# Patient Record
Sex: Female | Born: 1975 | Race: White | Hispanic: No | Marital: Married | Smoking: Never smoker
Health system: Southern US, Community
[De-identification: ages and names within clinical notes are randomized; demographics above are authoritative.]

## PROBLEM LIST (undated history)

## (undated) DIAGNOSIS — K219 Gastro-esophageal reflux disease without esophagitis: Secondary | ICD-10-CM

## (undated) HISTORY — DX: Gastro-esophageal reflux disease without esophagitis: K21.9

## (undated) HISTORY — PX: LASIK: SHX215

## (undated) HISTORY — PX: BREAST SURGERY: SHX581

---

## 1999-03-18 ENCOUNTER — Other Ambulatory Visit: Admission: RE | Admit: 1999-03-18 | Discharge: 1999-03-18 | Payer: Self-pay | Admitting: Family Medicine

## 1999-05-12 ENCOUNTER — Encounter: Payer: Self-pay | Admitting: Family Medicine

## 1999-05-12 ENCOUNTER — Ambulatory Visit (HOSPITAL_COMMUNITY): Admission: RE | Admit: 1999-05-12 | Discharge: 1999-05-12 | Payer: Self-pay | Admitting: Family Medicine

## 1999-07-07 ENCOUNTER — Ambulatory Visit (HOSPITAL_COMMUNITY): Admission: RE | Admit: 1999-07-07 | Discharge: 1999-07-07 | Payer: Self-pay | Admitting: Family Medicine

## 1999-07-07 ENCOUNTER — Encounter: Payer: Self-pay | Admitting: Family Medicine

## 1999-09-06 ENCOUNTER — Inpatient Hospital Stay (HOSPITAL_COMMUNITY): Admission: AD | Admit: 1999-09-06 | Discharge: 1999-09-06 | Payer: Self-pay | Admitting: Family Medicine

## 1999-09-18 ENCOUNTER — Inpatient Hospital Stay (HOSPITAL_COMMUNITY): Admission: AD | Admit: 1999-09-18 | Discharge: 1999-09-19 | Payer: Self-pay | Admitting: Family Medicine

## 2001-10-03 ENCOUNTER — Other Ambulatory Visit: Admission: RE | Admit: 2001-10-03 | Discharge: 2001-10-03 | Payer: Self-pay | Admitting: Family Medicine

## 2002-08-31 ENCOUNTER — Ambulatory Visit (HOSPITAL_COMMUNITY): Admission: RE | Admit: 2002-08-31 | Discharge: 2002-08-31 | Payer: Self-pay | Admitting: Family Medicine

## 2002-08-31 ENCOUNTER — Encounter: Payer: Self-pay | Admitting: Family Medicine

## 2002-09-12 ENCOUNTER — Ambulatory Visit (HOSPITAL_COMMUNITY): Admission: RE | Admit: 2002-09-12 | Discharge: 2002-09-12 | Payer: Self-pay | Admitting: General Surgery

## 2014-02-25 ENCOUNTER — Encounter (INDEPENDENT_AMBULATORY_CARE_PROVIDER_SITE_OTHER): Payer: Self-pay

## 2014-02-25 ENCOUNTER — Encounter: Payer: Self-pay | Admitting: General Practice

## 2014-02-25 ENCOUNTER — Ambulatory Visit (INDEPENDENT_AMBULATORY_CARE_PROVIDER_SITE_OTHER): Payer: BC Managed Care – PPO | Admitting: General Practice

## 2014-02-25 VITALS — BP 118/76 | HR 95 | Temp 98.7°F | Wt 247.8 lb

## 2014-02-25 DIAGNOSIS — J029 Acute pharyngitis, unspecified: Secondary | ICD-10-CM

## 2014-02-25 DIAGNOSIS — J0100 Acute maxillary sinusitis, unspecified: Secondary | ICD-10-CM

## 2014-02-25 LAB — POCT RAPID STREP A (OFFICE): Rapid Strep A Screen: NEGATIVE

## 2014-02-25 MED ORDER — AMOXICILLIN-POT CLAVULANATE 875-125 MG PO TABS: 1.0000 | ORAL_TABLET | Freq: Two times a day (BID) | ORAL | Status: DC

## 2014-02-25 NOTE — Progress Notes (Signed)
   Subjective:    Patient ID: Sharon Foster, female    DOB: 1976/01/26, 38 y.o.   MRN: [id number]  Sinusitis This is a new problem. Episode onset: onset 2 days ago. The problem has been gradually worsening since onset. The maximum temperature recorded prior to her arrival was 100 - 100.9 F. Her pain is at a severity of 3/10. Associated symptoms include ear pain, sinus pressure and a sore throat. Pertinent negatives include no chills, congestion, coughing, headaches, neck pain or shortness of breath. Past treatments include nothing.  Otalgia  There is pain in the left ear. This is a new problem. Episode onset: onset 2 days ago. The problem occurs every few minutes. The problem has been gradually worsening. The pain is at a severity of 3/10. Associated symptoms include a sore throat. Pertinent negatives include no abdominal pain, coughing, drainage, ear discharge, headaches, neck pain, rash or vomiting. She has tried nothing for the symptoms. There is no history of a chronic ear infection or hearing loss.      Review of Systems  Constitutional: Negative for fever and chills.  HENT: Positive for ear pain, sinus pressure and sore throat. Negative for congestion and ear discharge.   Respiratory: Negative for cough, chest tightness and shortness of breath.   Cardiovascular: Negative for chest pain and palpitations.  Gastrointestinal: Negative for vomiting and abdominal pain.  Musculoskeletal: Negative for neck pain.  Skin: Negative for rash.  Neurological: Negative for dizziness, weakness and headaches.       Objective:   Physical Exam  Constitutional: She is oriented to person, place, and time. She appears well-developed and well-nourished.  HENT:  Head: Atraumatic.  Right Ear: External ear normal.  Left Ear: External ear normal.  Nose: Left sinus exhibits maxillary sinus tenderness.  Eyes: EOM are normal. Pupils are equal, round, and reactive to light.  Neck: Normal range of motion.  No thyromegaly present.  Cardiovascular: Normal rate, regular rhythm and normal heart sounds.   Pulmonary/Chest: Effort normal and breath sounds normal. No respiratory distress. She exhibits no tenderness.  Lymphadenopathy:    She has no cervical adenopathy.  Neurological: She is alert and oriented to person, place, and time.  Skin: Skin is warm and dry.  Psychiatric: She has a normal mood and affect.   Results for orders placed in visit on 02/25/14  POCT RAPID STREP A (OFFICE)      Result Value Ref Range   Rapid Strep A Screen Negative  Negative        Assessment & Plan:  1. Sore throat - POCT rapid strep A  2. Acute maxillary sinusitis, recurrence not specified - amoxicillin-clavulanate (AUGMENTIN) 875-125 MG per tablet; Take 1 tablet by mouth 2 (two) times daily.  Dispense: 14 tablet; Refill: 0 -discussed and provided educational material on sinusitis -complete full course of antibiotic  -follow up if symptoms worsen or unresolved -Patient verbalized understanding Coralie KeensMae E. Jamariah Tony, FNP-C

## 2014-02-25 NOTE — Patient Instructions (Signed)
Sinusitis Sinusitis is redness, soreness, and swelling (inflammation) of the paranasal sinuses. Paranasal sinuses are air pockets within the bones of your face (beneath the eyes, the middle of the forehead, or above the eyes). In healthy paranasal sinuses, mucus is able to drain out, and air is able to circulate through them by way of your nose. However, when your paranasal sinuses are inflamed, mucus and air can become trapped. This can allow bacteria and other germs to grow and cause infection. Sinusitis can develop quickly and last only a short time (acute) or continue over a long period (chronic). Sinusitis that lasts for more than 12 weeks is considered chronic.  CAUSES  Causes of sinusitis include:  Allergies.  Structural abnormalities, such as displacement of the cartilage that separates your nostrils (deviated septum), which can decrease the air flow through your nose and sinuses and affect sinus drainage.  Functional abnormalities, such as when the small hairs (cilia) that line your sinuses and help remove mucus do not work properly or are not present. SYMPTOMS  Symptoms of acute and chronic sinusitis are the same. The primary symptoms are pain and pressure around the affected sinuses. Other symptoms include:  Upper toothache.  Earache.  Headache.  Bad breath.  Decreased sense of smell and taste.  A cough, which worsens when you are lying flat.  Fatigue.  Fever.  Thick drainage from your nose, which often is green and may contain pus (purulent).  Swelling and warmth over the affected sinuses. DIAGNOSIS  Your caregiver will perform a physical exam. During the exam, your caregiver may:  Look in your nose for signs of abnormal growths in your nostrils (nasal polyps).  Tap over the affected sinus to check for signs of infection.  View the inside of your sinuses (endoscopy) with a special imaging device with a light attached (endoscope), which is inserted into your  sinuses. If your caregiver suspects that you have chronic sinusitis, one or more of the following tests may be recommended:  Allergy tests.  Nasal culture--A sample of mucus is taken from your nose and sent to a lab and screened for bacteria.  Nasal cytology--A sample of mucus is taken from your nose and examined by your caregiver to determine if your sinusitis is related to an allergy. TREATMENT  Most cases of acute sinusitis are related to a viral infection and will resolve on their own within 10 days. Sometimes medicines are prescribed to help relieve symptoms (pain medicine, decongestants, nasal steroid sprays, or saline sprays).  However, for sinusitis related to a bacterial infection, your caregiver will prescribe antibiotic medicines. These are medicines that will help kill the bacteria causing the infection.  Rarely, sinusitis is caused by a fungal infection. In theses cases, your caregiver will prescribe antifungal medicine. For some cases of chronic sinusitis, surgery is needed. Generally, these are cases in which sinusitis recurs more than 3 times per year, despite other treatments. HOME CARE INSTRUCTIONS   Drink plenty of water. Water helps thin the mucus so your sinuses can drain more easily.  Use a humidifier.  Inhale steam 3 to 4 times a day (for example, sit in the bathroom with the shower running).  Apply a warm, moist washcloth to your face 3 to 4 times a day, or as directed by your caregiver.  Use saline nasal sprays to help moisten and clean your sinuses.  Take over-the-counter or prescription medicines for pain, discomfort, or fever only as directed by your caregiver. SEEK IMMEDIATE MEDICAL CARE IF:    You have increasing pain or severe headaches.  You have nausea, vomiting, or drowsiness.  You have swelling around your face.  You have vision problems.  You have a stiff neck.  You have difficulty breathing. MAKE SURE YOU:   Understand these  instructions.  Will watch your condition.  Will get help right away if you are not doing well or get worse. Document Released: 08/09/2005 Document Revised: 11/01/2011 Document Reviewed: 08/24/2011 Exit[employer] Patient Information 2015 [employer]. This information is not intended to replace advice given to you by your health care provider. Make sure you discuss any questions you have with your health care provider.  

## 2014-05-03 ENCOUNTER — Ambulatory Visit (INDEPENDENT_AMBULATORY_CARE_PROVIDER_SITE_OTHER): Payer: BC Managed Care – PPO | Admitting: Nurse Practitioner

## 2014-05-03 ENCOUNTER — Encounter: Payer: Self-pay | Admitting: Nurse Practitioner

## 2014-05-03 VITALS — BP 104/65 | HR 67 | Temp 97.6°F | Ht 65.0 in | Wt 242.6 lb

## 2014-05-03 DIAGNOSIS — J069 Acute upper respiratory infection, unspecified: Secondary | ICD-10-CM

## 2014-05-03 DIAGNOSIS — J029 Acute pharyngitis, unspecified: Secondary | ICD-10-CM

## 2014-05-03 LAB — POCT RAPID STREP A (OFFICE): Rapid Strep A Screen: NEGATIVE

## 2014-05-03 MED ORDER — AMOXICILLIN 875 MG PO TABS: 875.0000 mg | ORAL_TABLET | Freq: Two times a day (BID) | ORAL | Status: DC

## 2014-05-03 NOTE — Progress Notes (Signed)
   Subjective:    Patient ID: Sharon Foster, female    DOB: Feb 24, 1976, 38 y.o.   MRN: [id number]  HPI Patient in today with c/o sore throat and headache- started over a week ago- Has swollen glands in neck.    Review of Systems  Constitutional: Positive for appetite change. Negative for fever and chills.  HENT: Positive for congestion, sinus pressure, sore throat, trouble swallowing and voice change.   Respiratory: Negative for cough.   Cardiovascular: Negative.   Gastrointestinal: Negative.   Musculoskeletal: Negative.   Neurological: Negative.   Psychiatric/Behavioral: Negative.   All other systems reviewed and are negative.      Objective:   Physical Exam  Constitutional: She is oriented to person, place, and time. She appears well-developed and well-nourished. She appears distressed.  HENT:  Right Ear: Hearing, tympanic membrane, external ear and ear canal normal.  Left Ear: Hearing, tympanic membrane, external ear and ear canal normal.  Nose: Mucosal edema and rhinorrhea present. Right sinus exhibits no maxillary sinus tenderness and no frontal sinus tenderness. Left sinus exhibits no maxillary sinus tenderness and no frontal sinus tenderness.  Mouth/Throat: Uvula is midline. Posterior oropharyngeal erythema (mild) present.  Eyes: Pupils are equal, round, and reactive to light.  Neck: Normal range of motion.  Cardiovascular: Normal rate, regular rhythm and normal heart sounds.   Pulmonary/Chest: Effort normal and breath sounds normal.  Lymphadenopathy:    She has no cervical adenopathy.  Neurological: She is alert and oriented to person, place, and time.  Skin: Skin is warm and dry.  Psychiatric: She has a normal mood and affect. Her behavior is normal. Judgment and thought content normal.   BP 104/65  Pulse 67  Temp(Src) 97.6 F (36.4 C) (Oral)  Ht  (1.651 m)  Wt 242 lb 9.6 oz (110.043 kg)  BMI 40.37 kg/m2          Assessment & Plan:   1. Sore  throat   2. Upper respiratory infection with cough and congestion    Meds ordered this encounter  Medications  . amoxicillin (AMOXIL) 875 MG tablet    Sig: Take 1 tablet (875 mg total) by mouth 2 (two) times daily.    Dispense:  20 tablet    Refill:  0    Order Specific Question:  Supervising Provider    Answer:  Ernestina Penna [[id number]]   1. Take meds as prescribed 2. Use a cool mist humidifier especially during the winter months and when heat has been humid. 3. Use saline nose sprays frequently 4. Saline irrigations of the nose can be very helpful if done frequently.  * 4X daily for 1 week*  * Use of a nettie pot can be helpful with this. Follow directions with this* 5. Drink plenty of fluids 6. Keep thermostat turn down low 7.For any cough or congestion  Use plain Mucinex- regular strength or max strength is fine   * Children- consult with Pharmacist for dosing 8. For fever or aces or pains- take tylenol or ibuprofen appropriate for age and weight.  * for fevers greater than 101 orally you may alternate ibuprofen and tylenol every  3 hours.   Mary-Margaret Daphine Deutscher, FNP

## 2014-05-03 NOTE — Patient Instructions (Signed)

## 2014-05-09 ENCOUNTER — Telehealth: Payer: Self-pay | Admitting: Nurse Practitioner

## 2014-05-09 NOTE — Telephone Encounter (Signed)
Was given amoxicillin shouldn't need another antibiotic

## 2014-05-10 NOTE — Telephone Encounter (Signed)
Patient agreed to plan.

## 2014-05-10 NOTE — Telephone Encounter (Signed)
Nasal congestion with clear nasal discharge and ear pressure. Suggested she continue allergy medication, increase fluid intake, and add pseudoephedrine  1-2 tabs every 4-6 hrs PRN. Follow up if symptoms persist or worsen.

## 2014-07-01 ENCOUNTER — Encounter: Payer: Self-pay | Admitting: Nurse Practitioner

## 2014-07-01 ENCOUNTER — Encounter (INDEPENDENT_AMBULATORY_CARE_PROVIDER_SITE_OTHER): Payer: Self-pay

## 2014-07-01 ENCOUNTER — Ambulatory Visit (INDEPENDENT_AMBULATORY_CARE_PROVIDER_SITE_OTHER): Payer: BC Managed Care – PPO | Admitting: Nurse Practitioner

## 2014-07-01 VITALS — BP 132/88 | HR 87 | Temp 97.8°F | Ht 65.0 in | Wt 244.0 lb

## 2014-07-01 DIAGNOSIS — M25511 Pain in right shoulder: Secondary | ICD-10-CM

## 2014-07-01 DIAGNOSIS — R2231 Localized swelling, mass and lump, right upper limb: Secondary | ICD-10-CM

## 2014-07-01 DIAGNOSIS — M545 Low back pain, unspecified: Secondary | ICD-10-CM

## 2014-07-01 LAB — POCT UA - MICROSCOPIC ONLY
Bacteria, U Microscopic: NEGATIVE
Casts, Ur, LPF, POC: NEGATIVE
Crystals, Ur, HPF, POC: NEGATIVE
Mucus, UA: NEGATIVE
RBC, urine, microscopic: NEGATIVE
Yeast, UA: NEGATIVE

## 2014-07-01 LAB — POCT URINALYSIS DIPSTICK
Blood, UA: NEGATIVE
Leukocytes, UA: NEGATIVE
Nitrite, UA: NEGATIVE
Protein, UA: NEGATIVE
Spec Grav, UA: 1.01
Urobilinogen, UA: NEGATIVE
pH, UA: 6.5

## 2014-07-01 MED ORDER — CYCLOBENZAPRINE HCL 5 MG PO TABS: 5.0000 mg | ORAL_TABLET | Freq: Three times a day (TID) | ORAL | Status: DC | PRN

## 2014-07-01 MED ORDER — PREDNISONE (PAK) 10 MG PO TABS: ORAL_TABLET | Freq: Every day | ORAL | Status: DC

## 2014-07-01 NOTE — Progress Notes (Signed)
   Subjective:    Patient ID: Trula Sladearlene S Sarracino, female    DOB: Oct 04, 1975, 38 y.o.   MRN: [id number]  HPI Patient in today c/o : - cyst in left [place]- showed provider at health department but they were not concerned. - right shoulder pain- can raise arm up but when gets to a certain place it hurts -Has pulling sensation in mod lower back and her urine smells  Strong.    Review of Systems  Constitutional: Negative.   HENT: Negative.   Respiratory: Negative.   Genitourinary: Negative.   Neurological: Negative.   Psychiatric/Behavioral: Negative.   All other systems reviewed and are negative.      Objective:   Physical Exam  Constitutional: She is oriented to person, place, and time. She appears well-developed and well-nourished.  Cardiovascular: Normal rate, regular rhythm and normal heart sounds.   Pulmonary/Chest: Effort normal and breath sounds normal.  Abdominal: Soft. Bowel sounds are normal.  Musculoskeletal:  Decreased ROM of right arm due to shoulder pain with external rotation and abduction. Grips equal bil Low back pain on palpation- FROM of lumbar sine without pain- (-) SLR bil  Neurological: She is alert and oriented to person, place, and time. She has normal reflexes.  Skin: Skin is warm.  2cm non mobile superficial nodule right axillia- no drainage - no erythema  Psychiatric: She has a normal mood and affect. Her behavior is normal. Judgment and thought content normal.   BP 132/88 mmHg  Pulse 87  Temp(Src) 97.8 F (36.6 C) (Oral)  Ht 5\' 5"  (1.651 m)  Wt 244 lb (110.678 kg)  BMI 40.60 kg/m2          Assessment & Plan:  1. Bilateral low back pain without sciatica Moist heat  Rest Back strengthening exercises - POCT urinalysis dipstick - POCT UA - Microscopic Only  2. Pain in joint, shoulder region, right Rest shoulder - cyclobenzaprine (FLEXERIL) 5 MG tablet; Take 1 tablet (5 mg total) by mouth 3 (three) times daily as needed for muscle spasms.   Dispense: 30 tablet; Refill: 1 - predniSONE (STERAPRED UNI-PAK) 10 MG tablet; Take by mouth daily. As directed X 6 days  Dispense: 21 tablet; Refill: 0  3. Axillary mass, right Patient wants to watch- when insurance changes will schedule U/S   Mary-Margaret Daphine DeutscherMartin, FNP

## 2014-07-01 NOTE — Patient Instructions (Signed)

## 2015-01-06 ENCOUNTER — Encounter: Payer: Self-pay | Admitting: Family Medicine

## 2015-01-06 ENCOUNTER — Ambulatory Visit (INDEPENDENT_AMBULATORY_CARE_PROVIDER_SITE_OTHER): Payer: PRIVATE HEALTH INSURANCE | Admitting: Family Medicine

## 2015-01-06 VITALS — BP 100/57 | HR 89 | Temp 99.5°F | Ht 65.0 in | Wt 239.0 lb

## 2015-01-06 DIAGNOSIS — J019 Acute sinusitis, unspecified: Secondary | ICD-10-CM | POA: Diagnosis not present

## 2015-01-06 DIAGNOSIS — J209 Acute bronchitis, unspecified: Secondary | ICD-10-CM | POA: Diagnosis not present

## 2015-01-06 MED ORDER — AMOXICILLIN-POT CLAVULANATE 875-125 MG PO TABS: 1.0000 | ORAL_TABLET | Freq: Two times a day (BID) | ORAL | Status: DC

## 2015-01-06 NOTE — Progress Notes (Signed)
   Subjective:    Patient ID: Sharon Foster, female    DOB: Mar 22, 1976, 39 y.o.   MRN: [id number]  HPI   39 year old female comes in today with complaints of nasal congestion, rhinorrhea, post nasal drainage, low grade fever, and cough. She has taken Advil 600mg . this is been going on for about 3 days. The patient works in a nursing home. She has had a fever over the past 3 days at 201.   Review of Systems  Constitutional: Positive for fever, chills and fatigue.  HENT: Positive for congestion, ear pain, postnasal drip, rhinorrhea and sinus pressure.   Eyes: Negative.   Respiratory: Positive for cough.   Cardiovascular: Negative.   Gastrointestinal: Negative.   Endocrine: Negative.   Genitourinary: Negative.   Musculoskeletal: Negative.   Skin: Negative.   Allergic/Immunologic: Negative.   Neurological: Negative.   Hematological: Negative.   Psychiatric/Behavioral: Negative.        Objective:   Physical Exam  Constitutional: She is oriented to person, place, and time. She appears well-developed and well-nourished. No distress.  HENT:  Head: Normocephalic and atraumatic.  Right Ear: External ear normal.  Left Ear: External ear normal.  Mouth/Throat: Oropharynx is clear and moist. No oropharyngeal exudate.  Ethmoid and frontal sinus tenderness Nasal turbinate congestion and swelling bilaterally  Eyes: Conjunctivae and EOM are normal. Pupils are equal, round, and reactive to light. Right eye exhibits no discharge. Left eye exhibits no discharge. No scleral icterus.  Neck: Normal range of motion. Neck supple. No thyromegaly present.  Cardiovascular: Normal rate and regular rhythm.  Exam reveals no friction rub.   No murmur heard. Pulmonary/Chest: Effort normal. No respiratory distress. She has no wheezes. She has no rales. She exhibits no tenderness.  Rhonchi with coughing  Abdominal: She exhibits no mass.  Musculoskeletal: Normal range of motion.  Lymphadenopathy:    She  has no cervical adenopathy.  Neurological: She is alert and oriented to person, place, and time.  Skin: Skin is warm and dry. No rash noted.  Psychiatric: She has a normal mood and affect. Her behavior is normal. Judgment and thought content normal.  Nursing note and vitals reviewed.          Assessment & Plan:  1. Acute rhinosinusitis -Use nasal saline during the day, use Flonase at nighttime, and take an antihistamine peel as directed one daily -Drink plenty of fluids and take Tylenol or ibuprofen as needed for aches pains and fever - amoxicillin-clavulanate (AUGMENTIN) 875-125 MG per tablet; Take 1 tablet by mouth 2 (two) times daily.  Dispense: 20 tablet; Refill: 0  2. Acute bronchitis, unspecified organism -Take Mucinex as directed twice daily with a large glass of water  Patient Instructions  Drink plenty of fluids Take ibuprofen alternating with Tylenol as needed for aches pains and fever Take Mucinex maximum strength, blue and white in color, plain, 1 twice daily with a large glass of water for cough and congestion Use nasal saline frequently through the day Use the Flonase 1-2 sprays each nostril at bedtime--- stay on this regularly for several weeks Take antibiotic as directed Use one of the following 3---- Allegra, Zyrtec, or Claritin. Zyrtec is more likely to make you drowsy.   Nyra Capeson W. Mollye Guinta MD

## 2015-01-06 NOTE — Patient Instructions (Signed)
Drink plenty of fluids Take ibuprofen alternating with Tylenol as needed for aches pains and fever Take Mucinex maximum strength, blue and white in color, plain, 1 twice daily with a large glass of water for cough and congestion Use nasal saline frequently through the day Use the Flonase 1-2 sprays each nostril at bedtime--- stay on this regularly for several weeks Take antibiotic as directed Use one of the following 3---- Allegra, Zyrtec, or Claritin. Zyrtec is more likely to make you drowsy.

## 2015-01-13 ENCOUNTER — Telehealth: Payer: Self-pay | Admitting: Family Medicine

## 2015-01-13 NOTE — Telephone Encounter (Signed)
Lm - needs to know more about the mess - more about the coughing up blood

## 2015-01-14 NOTE — Telephone Encounter (Signed)
Pt states that she is still having cough, A LOT of congestion ( some blood streaked) (no blood today) Some light headedness, and NO fever  She feels that she needs a different antibiotic. She does not have the money to come in for another office visit. Seen 01/06/15- [facility]  [facility].  Please address

## 2015-01-14 NOTE — Telephone Encounter (Signed)
Continue current treatment and give antibiotics more time to work

## 2015-01-14 NOTE — Telephone Encounter (Signed)
Patient aware and will call us if she does not feel better.

## 2015-01-17 ENCOUNTER — Telehealth: Payer: Self-pay | Admitting: Family Medicine

## 2015-01-17 MED ORDER — SULFAMETHOXAZOLE-TRIMETHOPRIM 800-160 MG PO TABS: 1.0000 | ORAL_TABLET | Freq: Two times a day (BID) | ORAL | Status: DC

## 2015-01-17 NOTE — Telephone Encounter (Signed)
Continues to have productive cough, head congestion, and dizziness. Denies fever.  She completed Augmentin yesterday morning.  She is taking Mucinex OTC.

## 2015-01-17 NOTE — Telephone Encounter (Signed)
Septra DS sent in to pharmacy. Patient notified of suggestions.  She will call back if symptoms worsen or fail to improve.

## 2015-01-17 NOTE — Telephone Encounter (Signed)
Discontinue Augmentin and change to Septra DS 1 twice daily for 10 days with food for infection until completed Continue nasal saline and an antihistamine like Claritin, or Allegra, or Zyrtec. Also continue with Mucinex for cough and congestion--- the plain blue and white woman and take twice daily with a large glass of water

## 2015-05-13 ENCOUNTER — Telehealth: Payer: Self-pay | Admitting: Nurse Practitioner

## 2015-05-21 NOTE — Telephone Encounter (Signed)
Several attempts have been made to contact patient this encounter will be closed.  

## 2015-09-12 ENCOUNTER — Ambulatory Visit (INDEPENDENT_AMBULATORY_CARE_PROVIDER_SITE_OTHER): Payer: BC Managed Care – PPO | Admitting: Family Medicine

## 2015-09-12 ENCOUNTER — Encounter: Payer: Self-pay | Admitting: Family Medicine

## 2015-09-12 VITALS — BP 104/65 | HR 74 | Temp 98.3°F | Ht 65.0 in | Wt 245.0 lb

## 2015-09-12 DIAGNOSIS — E669 Obesity, unspecified: Secondary | ICD-10-CM | POA: Diagnosis not present

## 2015-09-12 DIAGNOSIS — R202 Paresthesia of skin: Secondary | ICD-10-CM | POA: Diagnosis not present

## 2015-09-12 DIAGNOSIS — L723 Sebaceous cyst: Secondary | ICD-10-CM

## 2015-09-12 DIAGNOSIS — I868 Varicose veins of other specified sites: Secondary | ICD-10-CM | POA: Diagnosis not present

## 2015-09-12 DIAGNOSIS — I8390 Asymptomatic varicose veins of unspecified lower extremity: Secondary | ICD-10-CM

## 2015-09-12 DIAGNOSIS — B372 Candidiasis of skin and nail: Secondary | ICD-10-CM

## 2015-09-12 MED ORDER — FLUCONAZOLE 100 MG PO TABS: 100.0000 mg | ORAL_TABLET | Freq: Every day | ORAL | Status: DC

## 2015-09-12 NOTE — Progress Notes (Signed)
Subjective:  Patient ID: Sharon Foster, female    DOB: [birth year]  Age: 40 y.o. MRN: [id number]  CC: numbness and tingling in hands and Cold Feet   HPI Sharon Foster presents for feet have a smell to them for two weeks. Symptom is mild to moderate. Turn blue in the bathtub. Onset several months ago. Seems to be increasing slightly. Numbness in both index fingers.  History Sharon Foster has a past medical history of GERD (gastroesophageal reflux disease).   She has past surgical history that includes Breast surgery and LASIK.   Her family history includes Cancer in her father; Hypertension in her father and mother.She reports that she has never smoked. She has never used smokeless tobacco. She reports that she does not drink alcohol or use illicit drugs.  Outpatient Prescriptions Prior to Visit  Medication Sig Dispense Refill  . esomeprazole (NEXIUM) 40 MG capsule Take 40 mg by mouth daily at 12 noon. Reported on 09/12/2015    . amoxicillin-clavulanate (AUGMENTIN) 875-125 MG per tablet Take 1 tablet by mouth 2 (two) times daily. 20 tablet 0  . sulfamethoxazole-trimethoprim (BACTRIM DS,SEPTRA DS) 800-160 MG per tablet Take 1 tablet by mouth 2 (two) times daily with a meal. 20 tablet 0   No facility-administered medications prior to visit.    ROS Review of Systems  Constitutional: Negative for fever, activity change and appetite change.  HENT: Negative for congestion, rhinorrhea and sore throat.   Eyes: Negative for visual disturbance.  Respiratory: Negative for cough and shortness of breath.   Cardiovascular: Negative for chest pain and palpitations.  Gastrointestinal: Negative for nausea, abdominal pain and diarrhea.  Genitourinary: Negative for dysuria.  Musculoskeletal: Negative for myalgias and arthralgias.    Objective:  BP 104/65 mmHg  Pulse 74  Temp(Src) 98.3 F (36.8 C) (Oral)  Ht _0  (1.651 m)  Wt 245 lb (111.131 kg)  BMI 40.77 kg/m2  SpO2 99%  BP Readings  from Last 3 Encounters:  09/12/15 104/65  01/06/15 100/57  07/01/14 132/88    Wt Readings from Last 3 Encounters:  09/12/15 245 lb (111.131 kg)  01/06/15 239 lb (108.41 kg)  07/01/14 244 lb (110.678 kg)     Physical Exam  Constitutional: She is oriented to person, place, and time. She appears well-developed and well-nourished. No distress.  HENT:  Head: Normocephalic and atraumatic.  Right Ear: External ear normal.  Left Ear: External ear normal.  Nose: Nose normal.  Mouth/Throat: Oropharynx is clear and moist.  Eyes: Conjunctivae and EOM are normal. Pupils are equal, round, and reactive to light.  Neck: Normal range of motion. Neck supple. No thyromegaly present.  Cardiovascular: Normal rate, regular rhythm and normal heart sounds.   No murmur heard. Multiple varicosities covering much of the dorsal forefoot bilaterally. Ropey varicosities in both calves  Pulmonary/Chest: Effort normal and breath sounds normal. No respiratory distress. She has no wheezes. She has no rales.  Abdominal: Soft. Bowel sounds are normal. She exhibits no distension. There is no tenderness.  Lymphadenopathy:    She has no cervical adenopathy.  Neurological: She is alert and oriented to person, place, and time. She has normal reflexes.  Skin: Skin is warm and dry. Lesion (1 cm freely mobile cyst without signs of infection at the right axilla.) noted.  Slight scale between toes mild scaling and erythema on the dorsum of the feet  Psychiatric: She has a normal mood and affect. Her behavior is normal. Judgment and thought content normal.  Lab Results  Component Value Date   WBC 5.0 09/12/2015   HCT 41.5 09/12/2015   PLT 198 09/12/2015   GLUCOSE 104* 09/12/2015   CHOL 102 09/12/2015   TRIG 52 09/12/2015   HDL 41 09/12/2015   LDLCALC 51 09/12/2015   ALT 14 09/12/2015   AST 21 09/12/2015   NA 141 09/12/2015   K 4.2 09/12/2015   CL 103 09/12/2015   CREATININE 0.93 09/12/2015   BUN 18  09/12/2015   CO2 26 09/12/2015    No results found.  Assessment & Plan:   Sharon Foster was seen today for numbness and tingling in hands and cold feet.  Diagnoses and all orders for this visit:  Varicose veins -     Ambulatory referral to Vascular Surgery -     CBC with Differential/Platelet -     CMP14+EGFR -     Lipid panel  Yeast dermatitis -     Ambulatory referral to Dermatology -     CBC with Differential/Platelet -     CMP14+EGFR -     Lipid panel  Paresthesia of both hands -     NCV with EMG(electromyography); Future -     CBC with Differential/Platelet -     CMP14+EGFR -     Lipid panel  Sebaceous cyst of right axilla -     Ambulatory referral to Dermatology -     CBC with Differential/Platelet -     CMP14+EGFR -     Lipid panel  Obesity  Other orders -     fluconazole (DIFLUCAN) 100 MG tablet; Take 1 tablet (100 mg total) by mouth daily.   I have discontinued Sharon Foster's amoxicillin-clavulanate and sulfamethoxazole-trimethoprim. I am also having her start on fluconazole. Additionally, I am having her maintain her esomeprazole.  Meds ordered this encounter  Medications  . fluconazole (DIFLUCAN) 100 MG tablet    Sig: Take 1 tablet (100 mg total) by mouth daily.    Dispense:  15 tablet    Refill:  0     Follow-up: Return in about 1 month (around 10/13/2015).  Sharon Foster, M.D.

## 2015-09-12 NOTE — Patient Instructions (Signed)

## 2015-09-13 LAB — CMP14+EGFR
A/G RATIO: 1.6 (ref 1.1–2.5)
ALK PHOS: 75 IU/L (ref 39–117)
ALT: 14 IU/L (ref 0–32)
AST: 21 IU/L (ref 0–40)
Albumin: 4.2 g/dL (ref 3.5–5.5)
BUN/Creatinine Ratio: 19 (ref 8–20)
BUN: 18 mg/dL (ref 6–20)
Bilirubin Total: 0.8 mg/dL (ref 0.0–1.2)
CHLORIDE: 103 mmol/L (ref 96–106)
CO2: 26 mmol/L (ref 18–29)
Calcium: 9.2 mg/dL (ref 8.7–10.2)
Creatinine, Ser: 0.93 mg/dL (ref 0.57–1.00)
GFR calc Af Amer: 90 mL/min/{1.73_m2} (ref >59)
GFR calc non Af Amer: 78 mL/min/{1.73_m2} (ref >59)
Globulin, Total: 2.7 g/dL (ref 1.5–4.5)
Glucose: 104 mg/dL — ABNORMAL HIGH (ref 65–99)
Potassium: 4.2 mmol/L (ref 3.5–5.2)
SODIUM: 141 mmol/L (ref 134–144)
Total Protein: 6.9 g/dL (ref 6.0–8.5)

## 2015-09-13 LAB — CBC WITH DIFFERENTIAL/PLATELET
BASOS ABS: 0 10*3/uL (ref 0.0–0.2)
Basos: 1 %
EOS (ABSOLUTE): 0.1 10*3/uL (ref 0.0–0.4)
EOS: 1 %
HEMATOCRIT: 41.5 % (ref 34.0–46.6)
Hemoglobin: 14.4 g/dL (ref 11.1–15.9)
Immature Grans (Abs): 0 10*3/uL (ref 0.0–0.1)
Immature Granulocytes: 0 %
LYMPHS ABS: 1.9 10*3/uL (ref 0.7–3.1)
Lymphs: 38 %
MCH: 32 pg (ref 26.6–33.0)
MCHC: 34.7 g/dL (ref 31.5–35.7)
MCV: 92 fL (ref 79–97)
MONOCYTES: 5 %
MONOS ABS: 0.3 10*3/uL (ref 0.1–0.9)
NEUTROS ABS: 2.7 10*3/uL (ref 1.4–7.0)
Neutrophils: 55 %
Platelets: 198 10*3/uL (ref 150–379)
RBC: 4.5 x10E6/uL (ref 3.77–5.28)
RDW: 12 % — AB (ref 12.3–15.4)
WBC: 5 10*3/uL (ref 3.4–10.8)

## 2015-09-13 LAB — LIPID PANEL
CHOLESTEROL TOTAL: 102 mg/dL (ref 100–199)
Chol/HDL Ratio: 2.5 ratio units (ref 0.0–4.4)
HDL: 41 mg/dL (ref >39)
LDL CALC: 51 mg/dL (ref 0–99)
TRIGLYCERIDES: 52 mg/dL (ref 0–149)
VLDL Cholesterol Cal: 10 mg/dL (ref 5–40)

## 2015-10-17 ENCOUNTER — Telehealth: Payer: Self-pay | Admitting: Nurse Practitioner

## 2016-08-27 ENCOUNTER — Ambulatory Visit (INDEPENDENT_AMBULATORY_CARE_PROVIDER_SITE_OTHER): Payer: BC Managed Care – PPO | Admitting: Family Medicine

## 2016-08-27 ENCOUNTER — Encounter: Payer: Self-pay | Admitting: Family Medicine

## 2016-08-27 VITALS — BP 115/75 | Temp 98.5°F | Resp 18 | Ht 65.0 in | Wt 229.8 lb

## 2016-08-27 DIAGNOSIS — J0100 Acute maxillary sinusitis, unspecified: Secondary | ICD-10-CM

## 2016-08-27 MED ORDER — AZITHROMYCIN 250 MG PO TABS: ORAL_TABLET | ORAL | 0 refills | Status: DC

## 2016-08-27 NOTE — Progress Notes (Signed)
BP 115/75 (BP Location: Left Arm, Patient Position: Sitting, Cuff Size: Normal)   Temp 98.5 F (36.9 C) (Oral)   Resp 18   Ht 5\' 5"  (1.651 m)   Wt 229 lb 12.8 oz (104.2 kg)   SpO2 98%   BMI 38.24 kg/m    Subjective:    Patient ID: Sharon Foster, female    DOB: April 21, 1976, 41 y.o.   MRN: [id number]  HPI: Sharon Foster is a 41 y.o. female presenting on 08/27/2016 for URI (prod cough (dk green), congestion, fever started on Tuesday)   HPI Cough and congestion and fever Patient comes in today complaining of cough and congestion and fever this been going on for the past 3 days. She has been having productive green sputum along with her cough and also sinus pressure and postnasal drainage. She says her fever was low-grade and was 99. She said that she also had some chills but no body aches. She denies any sick contacts that she knows of. She has been using some over-the-counter sinus and cold medications without much success.  Relevant past medical, surgical, family and social history reviewed and updated as indicated. Interim medical history since our last visit reviewed. Allergies and medications reviewed and updated.  Review of Systems  Constitutional: Positive for fever. Negative for chills.  HENT: Positive for congestion, postnasal drip, rhinorrhea, sinus pressure and sore throat. Negative for ear discharge, ear pain and sneezing.   Eyes: Negative for pain, redness and visual disturbance.  Respiratory: Positive for cough. Negative for chest tightness and shortness of breath.   Cardiovascular: Negative for chest pain and leg swelling.  Genitourinary: Negative for difficulty urinating and dysuria.  Musculoskeletal: Negative for back pain and gait problem.  Skin: Negative for rash.  Neurological: Negative for light-headedness and headaches.  Psychiatric/Behavioral: Negative for agitation and behavioral problems.  All other systems reviewed and are negative.   Per HPI unless  specifically indicated above     Objective:    BP 115/75 (BP Location: Left Arm, Patient Position: Sitting, Cuff Size: Normal)   Temp 98.5 F (36.9 C) (Oral)   Resp 18   Ht 5\' 5"  (1.651 m)   Wt 229 lb 12.8 oz (104.2 kg)   SpO2 98%   BMI 38.24 kg/m   Wt Readings from Last 3 Encounters:  08/27/16 229 lb 12.8 oz (104.2 kg)  09/12/15 245 lb (111.1 kg)  01/06/15 239 lb (108.4 kg)    Physical Exam  Constitutional: She is oriented to person, place, and time. She appears well-developed and well-nourished. No distress.  HENT:  Right Ear: Tympanic membrane, external ear and ear canal normal.  Left Ear: Tympanic membrane, external ear and ear canal normal.  Nose: Mucosal edema and rhinorrhea present. No epistaxis. Right sinus exhibits maxillary sinus tenderness. Right sinus exhibits no frontal sinus tenderness. Left sinus exhibits maxillary sinus tenderness. Left sinus exhibits no frontal sinus tenderness.  Mouth/Throat: Uvula is midline and mucous membranes are normal. Posterior oropharyngeal edema and posterior oropharyngeal erythema present. No oropharyngeal exudate or tonsillar abscesses.  Eyes: Conjunctivae and EOM are normal.  Cardiovascular: Normal rate, regular rhythm, normal heart sounds and intact distal pulses.   No murmur heard. Pulmonary/Chest: Effort normal and breath sounds normal. No respiratory distress. She has no wheezes.  Musculoskeletal: Normal range of motion. She exhibits no edema or tenderness.  Neurological: She is alert and oriented to person, place, and time. Coordination normal.  Skin: Skin is warm and dry. No rash noted.  She is not diaphoretic.  Psychiatric: She has a normal mood and affect. Her behavior is normal.  Vitals reviewed.     Assessment & Plan:   Problem List Items Addressed This Visit    None    Visit Diagnoses    Acute non-recurrent maxillary sinusitis    -  Primary   Relevant Medications   azithromycin (ZITHROMAX) 250 MG tablet        Follow up plan: Return if symptoms worsen or fail to improve.  Counseling provided for all of the vaccine components No orders of the defined types were placed in this encounter.   Arville Care, MD [facility] 08/27/2016, 6:38 PM

## 2016-08-30 ENCOUNTER — Telehealth: Payer: Self-pay | Admitting: Family Medicine

## 2016-08-30 MED ORDER — PREDNISONE 20 MG PO TABS: ORAL_TABLET | ORAL | 0 refills | Status: DC

## 2016-08-30 NOTE — Telephone Encounter (Signed)
Pt aware.

## 2016-08-31 ENCOUNTER — Ambulatory Visit (INDEPENDENT_AMBULATORY_CARE_PROVIDER_SITE_OTHER): Payer: BC Managed Care – PPO | Admitting: Family Medicine

## 2016-08-31 ENCOUNTER — Encounter: Payer: Self-pay | Admitting: Family Medicine

## 2016-08-31 ENCOUNTER — Ambulatory Visit (INDEPENDENT_AMBULATORY_CARE_PROVIDER_SITE_OTHER): Payer: BC Managed Care – PPO

## 2016-08-31 VITALS — BP 112/67 | HR 83 | Temp 97.9°F | Ht 65.0 in | Wt 230.4 lb

## 2016-08-31 DIAGNOSIS — R05 Cough: Secondary | ICD-10-CM

## 2016-08-31 DIAGNOSIS — J069 Acute upper respiratory infection, unspecified: Secondary | ICD-10-CM

## 2016-08-31 DIAGNOSIS — R059 Cough, unspecified: Secondary | ICD-10-CM

## 2016-08-31 MED ORDER — HYDROCODONE-HOMATROPINE 5-1.5 MG/5ML PO SYRP: 5.0000 mL | ORAL_SOLUTION | Freq: Four times a day (QID) | ORAL | 0 refills | Status: DC | PRN

## 2016-08-31 NOTE — Progress Notes (Signed)
   Subjective:    Patient ID: Sharon Foster, female    DOB: [birth year], 41 y.o.   MRN: [id number]  HPI 41 year old female with multiple symptoms. She was treated about one week ago with a Z-Pak. Since that treatment her fever has gone. Her cough is no longer productive. She still continues to cough. She says she has not slept in 3 days due to the cough. There is fullness in her ears. She was started on prednisone day prior to visit and has not seen improvement. She also complains of some numbness in her right hand. This does not seem to be related to her cough.  There are no active problems to display for this patient.  Outpatient Encounter Prescriptions as of 08/31/2016  Medication Sig  . esomeprazole (NEXIUM) 40 MG capsule Take 40 mg by mouth daily at 12 noon. Reported on 09/12/2015  . predniSONE (DELTASONE) 20 MG tablet 2 po at same time daily for 5 days  . [DISCONTINUED] azithromycin (ZITHROMAX) 250 MG tablet Take 2 the first day and then one each day after.   No facility-administered encounter medications on file as of 08/31/2016.       Review of Systems  Constitutional: Negative.   HENT: Positive for congestion.   Respiratory: Positive for cough.   Musculoskeletal: Positive for back pain.       Objective:   Physical Exam  Constitutional: She is oriented to person, place, and time. She appears well-developed and well-nourished.  She looks well  HENT:  Mouth/Throat: Oropharynx is clear and moist.  Cardiovascular: Normal rate, regular rhythm and normal heart sounds.   Pulmonary/Chest: Effort normal and breath sounds normal.  Musculoskeletal: Normal range of motion.  Neurological: She is alert and oriented to person, place, and time.   BP 112/67   Pulse 83   Temp 97.9 F (36.6 C) (Oral)   Ht 5\' 5"  (1.651 m)   Wt 230 lb 6.4 oz (104.5 kg)   BMI 38.34 kg/m   Chest x-ray is normal      Assessment & Plan:  1. Cough Think the cough is likely related to some residual  bronchial inflammation after bronchitis. Rather than prednisone with it's more systemic side effects have given her a sample of Symbicort to use - DG Chest 2 View; Future  2. Acute upper respiratory infection See above for treatment  Frederica KusterStephen M Miller MD

## 2016-09-06 ENCOUNTER — Telehealth: Payer: Self-pay | Admitting: Family Medicine

## 2016-09-06 MED ORDER — AMOXICILLIN-POT CLAVULANATE 875-125 MG PO TABS: 1.0000 | ORAL_TABLET | Freq: Every day | ORAL | 0 refills | Status: DC

## 2016-09-06 MED ORDER — AMOXICILLIN-POT CLAVULANATE 875-125 MG PO TABS: 1.0000 | ORAL_TABLET | Freq: Two times a day (BID) | ORAL | 0 refills | Status: DC

## 2016-09-06 NOTE — Telephone Encounter (Signed)
Try Augmentin 875 one twice daily for infection until completed for 10 days take with food. Take Mucinex maximum strength, plain 1 twice daily with a large glass of water

## 2016-09-06 NOTE — Telephone Encounter (Signed)
Aware, more medication sent in .

## 2016-09-06 NOTE — Addendum Note (Signed)
Addended by: Almeta MonasSTONE, JANIE M on: 09/06/2016 03:05 PM   Modules accepted: Orders

## 2016-09-06 NOTE — Telephone Encounter (Signed)
Pt saw Dr Hyacinth MeekerMiller last week  DXed with bronchitis Finished Zpak Pt has continued congestion with prod cough(green) Wants antibiotic Please review and advise

## 2016-11-03 ENCOUNTER — Ambulatory Visit: Payer: BC Managed Care – PPO | Admitting: Pediatrics

## 2016-11-03 ENCOUNTER — Ambulatory Visit (INDEPENDENT_AMBULATORY_CARE_PROVIDER_SITE_OTHER): Payer: BC Managed Care – PPO | Admitting: Family Medicine

## 2016-11-03 ENCOUNTER — Encounter: Payer: Self-pay | Admitting: *Deleted

## 2016-11-03 ENCOUNTER — Encounter: Payer: Self-pay | Admitting: Family Medicine

## 2016-11-03 ENCOUNTER — Ambulatory Visit: Payer: BC Managed Care – PPO

## 2016-11-03 VITALS — BP 108/68 | HR 93 | Temp 98.4°F | Ht 65.0 in | Wt 235.0 lb

## 2016-11-03 DIAGNOSIS — B029 Zoster without complications: Secondary | ICD-10-CM | POA: Diagnosis not present

## 2016-11-03 MED ORDER — VALACYCLOVIR HCL 1 G PO TABS: 1000.0000 mg | ORAL_TABLET | Freq: Two times a day (BID) | ORAL | 0 refills | Status: DC

## 2016-11-03 NOTE — Progress Notes (Signed)
   BP 108/68 (BP Location: Left Arm)   Pulse 93   Temp 98.4 F (36.9 C) (Oral)   Ht 5\' 5"  (1.651 m)   Wt 235 lb (106.6 kg)   BMI 39.11 kg/m    Subjective:    Patient ID: Sharon Foster, female    DOB: 1976/02/04, 41 y.o.   MRN: [id number]  HPI: Sharon Foster is a 41 y.o. female presenting on 11/03/2016 for Rash (?shingles)   HPI Rash on left side from neck to shoulder. Patient has a rash that extends from the left side of her neck down to her shoulder and is very irritating and burning sensation that she describes. She says the pain hurts very much at some times and the burning especially. She says it's a 5 out of 10. She has a few small bumps that have started to open up but other than that no significant rash anywhere else.  Relevant past medical, surgical, family and social history reviewed and updated as indicated. Interim medical history since our last visit reviewed. Allergies and medications reviewed and updated.  Review of Systems  Constitutional: Negative for chills and fever.  HENT: Negative for congestion, ear discharge and ear pain.   Eyes: Negative for redness and visual disturbance.  Respiratory: Negative for chest tightness and shortness of breath.   Cardiovascular: Negative for chest pain and leg swelling.  Genitourinary: Negative for difficulty urinating and dysuria.  Musculoskeletal: Negative for back pain and gait problem.  Skin: Positive for rash.  Neurological: Negative for light-headedness and headaches.  Psychiatric/Behavioral: Negative for agitation and behavioral problems.  All other systems reviewed and are negative.   Per HPI unless specifically indicated above        Objective:    BP 108/68 (BP Location: Left Arm)   Pulse 93   Temp 98.4 F (36.9 C) (Oral)   Ht 5\' 5"  (1.651 m)   Wt 235 lb (106.6 kg)   BMI 39.11 kg/m   Wt Readings from Last 3 Encounters:  11/03/16 235 lb (106.6 kg)  08/31/16 230 lb 6.4 oz (104.5 kg)  08/27/16 229  lb 12.8 oz (104.2 kg)    Physical Exam  Constitutional: She is oriented to person, place, and time. She appears well-developed and well-nourished. No distress.  Eyes: Conjunctivae are normal.  Musculoskeletal: Normal range of motion. She exhibits no edema or tenderness.  Neurological: She is alert and oriented to person, place, and time. Coordination normal.  Skin: Skin is warm and dry. Rash (5 or 6 small papules scattered from left side of neck to superior shoulder, no vesicles noted but mostly lesions are open and excoriated.) noted. She is not diaphoretic.  Psychiatric: She has a normal mood and affect. Her behavior is normal.  Nursing note and vitals reviewed.     Assessment & Plan:   Problem List Items Addressed This Visit    None    Visit Diagnoses    Herpes zoster without complication    -  Primary   Relevant Medications   valACYclovir (VALTREX) 1000 MG tablet       Follow up plan: Return if symptoms worsen or fail to improve.  Counseling provided for all of the vaccine components No orders of the defined types were placed in this encounter.   Arville CareJoshua Kurt Azimi, MD [facility] 11/03/2016, 4:53 PM

## 2016-11-04 ENCOUNTER — Encounter: Payer: Self-pay | Admitting: Family

## 2016-11-04 ENCOUNTER — Ambulatory Visit (INDEPENDENT_AMBULATORY_CARE_PROVIDER_SITE_OTHER): Payer: BC Managed Care – PPO | Admitting: Family

## 2016-11-04 VITALS — BP 113/73 | HR 91 | Temp 98.4°F | Ht 65.0 in | Wt 235.0 lb

## 2016-11-04 DIAGNOSIS — B349 Viral infection, unspecified: Secondary | ICD-10-CM | POA: Diagnosis not present

## 2016-11-04 NOTE — Patient Instructions (Addendum)
Viral Illness, Adult Viruses are tiny germs that can get into a person's body and cause illness. There are many different types of viruses, and they cause many types of illness. Viral illnesses can range from mild to severe. They can affect various parts of the body. Common illnesses that are caused by a virus include colds and the flu. Viral illnesses also include serious conditions such as HIV/AIDS (human immunodeficiency virus/acquired immunodeficiency syndrome). A few viruses have been linked to certain cancers. What are the causes? Many types of viruses can cause illness. Viruses invade cells in your body, multiply, and cause the infected cells to malfunction or die. When the cell dies, it releases more of the virus. When this happens, you develop symptoms of the illness, and the virus continues to spread to other cells. If the virus takes over the function of the cell, it can cause the cell to divide and grow out of control, as is the case when a virus causes cancer. Different viruses get into the body in different ways. You can get a virus by:  Swallowing food or water that is contaminated with the virus.  Breathing in droplets that have been coughed or sneezed into the air by an infected person.  Touching a surface that has been contaminated with the virus and then touching your eyes, nose, or mouth.  Being bitten by an insect or animal that carries the virus.  Having sexual contact with a person who is infected with the virus.  Being exposed to blood or fluids that contain the virus, either through an open cut or during a transfusion. If a virus enters your body, your body's defense system (immune system) will try to fight the virus. You may be at higher risk for a viral illness if your immune system is weak. What are the signs or symptoms? Symptoms vary depending on the type of virus and the location of the cells that it invades. Common symptoms of the main types of viral illnesses  include: Cold and flu viruses   Fever.  Headache.  Sore throat.  Muscle aches.  Nasal congestion.  Cough. Digestive system (gastrointestinal) viruses   Fever.  Abdominal pain.  Nausea.  Diarrhea. Liver viruses (hepatitis)   Loss of appetite.  Tiredness.  Yellowing of the skin (jaundice). Brain and spinal cord viruses   Fever.  Headache.  Stiff neck.  Nausea and vomiting.  Confusion or sleepiness. Skin viruses   Warts.  Itching.  Rash. Sexually transmitted viruses   Discharge.  Swelling.  Redness.  Rash. How is this treated? Viruses can be difficult to treat because they live within cells. Antibiotic medicines do not treat viruses because these drugs do not get inside cells. Treatment for a viral illness may include:  Resting and drinking plenty of fluids.  Medicines to relieve symptoms. These can include over-the-counter medicine for pain and fever, medicines for cough or congestion, and medicines to relieve diarrhea.  Antiviral medicines. These drugs are available only for certain types of viruses. They may help reduce flu symptoms if taken early. There are also many antiviral medicines for hepatitis and HIV/AIDS. Some viral illnesses can be prevented with vaccinations. A common example is the flu shot. Follow these instructions at home: Medicines    Take over-the-counter and prescription medicines only as told by your health care provider.  If you were prescribed an antiviral medicine, take it as told by your health care provider. Do not stop taking the medicine even if you start to   feel better.  Be aware of when antibiotics are needed and when they are not needed. Antibiotics do not treat viruses. If your health care provider thinks that you may have a bacterial infection as well as a viral infection, you may get an antibiotic.  Do not ask for an antibiotic prescription if you have been diagnosed with a viral illness. That will not make  your illness go away faster.  Frequently taking antibiotics when they are not needed can lead to antibiotic resistance. When this develops, the medicine no longer works against the bacteria that it normally fights. General instructions   Drink enough fluids to keep your urine clear or pale yellow.  Rest as much as possible.  Return to your normal activities as told by your health care provider. Ask your health care provider what activities are safe for you.  Keep all follow-up visits as told by your health care provider. This is important. How is this prevented? Take these actions to reduce your risk of viral infection:  Eat a healthy diet and get enough rest.  Wash your hands often with soap and water. This is especially important when you are in public places. If soap and water are not available, use hand sanitizer.  Avoid close contact with friends and family who have a viral illness.  If you travel to areas where viral gastrointestinal infection is common, avoid drinking water or eating raw food.  Keep your immunizations up to date. Get a flu shot every year as told by your health care provider.  Do not share toothbrushes, nail clippers, razors, or needles with other people.  Always practice safe sex. Contact a health care provider if:  You have symptoms of a viral illness that do not go away.  Your symptoms come back after going away.  Your symptoms get worse. Get help right away if:  You have trouble breathing.  You have a severe headache or a stiff neck.  You have severe vomiting or abdominal pain. This information is not intended to replace advice given to you by your health care provider. Make sure you discuss any questions you have with your health care provider. Document Released: 12/19/2015 Document Revised: 01/21/2016 Document Reviewed: 12/19/2015 Elsevier Interactive Patient Education  2017 [employer]  

## 2016-11-04 NOTE — Progress Notes (Signed)
   Subjective:    Patient ID: Sharon Foster, female    DOB: 23-Mar-1976, 41 y.o.   MRN: [id number]  PT presents to the office today to recheck rash. Pt saw a provider yesterday and was told she possibly could have shingles and was started on Valtrex.  Rash  The current episode started in the past 7 days (Saturday). The problem has been rapidly improving since onset. The affected locations include the back, head and left shoulder. The rash is characterized by itchiness and redness. She was exposed to nothing. Pertinent negatives include no congestion, cough, diarrhea, fatigue, fever, shortness of breath or sore throat. Past treatments include antibiotics (valtrex). The treatment provided mild relief.      Review of Systems  Constitutional: Negative for fatigue and fever.  HENT: Negative for congestion and sore throat.   Respiratory: Negative for cough and shortness of breath.   Gastrointestinal: Negative for diarrhea.  Skin: Positive for rash.  All other systems reviewed and are negative.      Objective:   Physical Exam  Constitutional: She is oriented to person, place, and time. She appears well-developed and well-nourished. No distress.  HENT:  Head: Normocephalic.  Eyes: Pupils are equal, round, and reactive to light.  Neck: Normal range of motion. Neck supple. No thyromegaly present.  Cardiovascular: Normal rate, regular rhythm, normal heart sounds and intact distal pulses.   No murmur heard. Pulmonary/Chest: Effort normal and breath sounds normal. No respiratory distress. She has no wheezes.  Abdominal: Soft. Bowel sounds are normal. She exhibits no distension. There is no tenderness.  Musculoskeletal: Normal range of motion. She exhibits no edema or tenderness.  Neurological: She is alert and oriented to person, place, and time.  Skin: Skin is warm and dry. Rash noted.   3 scatter pustules scabbed over and two pustules in her scalp   Psychiatric: She has a normal mood and  affect. Her behavior is normal. Judgment and thought content normal.  Vitals reviewed.     BP 113/73   Pulse 91   Temp 98.4 F (36.9 C) (Oral)   Ht 5\' 5"  (1.651 m)   Wt 235 lb (106.6 kg)   BMI 39.11 kg/m      Assessment & Plan:  1. Viral illness I do not believe this is shingles. Discussed with patient. Told to continue Valtrex  Do not scratch Keep clean and dry Note given to patient that she can return to work tomorrow  Jannifer Rodneyhristy Rita Vialpando, FNP

## 2017-01-11 ENCOUNTER — Ambulatory Visit (INDEPENDENT_AMBULATORY_CARE_PROVIDER_SITE_OTHER): Payer: BC Managed Care – PPO | Admitting: Physician Assistant

## 2017-01-11 ENCOUNTER — Encounter: Payer: Self-pay | Admitting: Physician Assistant

## 2017-01-11 VITALS — BP 110/66 | HR 91 | Temp 97.5°F | Ht 65.0 in | Wt 235.0 lb

## 2017-01-11 DIAGNOSIS — J0100 Acute maxillary sinusitis, unspecified: Secondary | ICD-10-CM | POA: Diagnosis not present

## 2017-01-11 MED ORDER — AMOXICILLIN 500 MG PO CAPS: 1000.0000 mg | ORAL_CAPSULE | Freq: Two times a day (BID) | ORAL | 0 refills | Status: DC

## 2017-01-11 NOTE — Progress Notes (Signed)
BP 110/66   Pulse 91   Temp 97.5 F (36.4 C) (Oral)   Ht 5\' 5"  (1.651 m)   Wt 235 lb (106.6 kg)   BMI 39.11 kg/m    Subjective:    Patient ID: Sharon Foster, female    DOB: [birth year], 41 y.o.   MRN: [id number]  HPI: Sharon Foster is a 41 y.o. female presenting on 01/11/2017 for Fatigue; Tinnitus; Fever; Chills; and Cough  This patient has had many days of sinus headache and postnasal drainage. There is copious drainage at times. Denies any fever at this time. There has been a history of sinus infections in the past.  No history of sinus surgery. There is cough at night. It has become more prevalent in recent days.  Relevant past medical, surgical, family and social history reviewed and updated as indicated. Allergies and medications reviewed and updated.  Past Medical History:  Diagnosis Date  . GERD (gastroesophageal reflux disease)     Past Surgical History:  Procedure Laterality Date  . BREAST SURGERY    . LASIK      Review of Systems  Constitutional: Positive for chills and fatigue. Negative for activity change, appetite change and fever.  HENT: Positive for congestion, postnasal drip, sinus pain, sinus pressure and sore throat.   Eyes: Negative.   Respiratory: Positive for cough. Negative for shortness of breath and wheezing.   Cardiovascular: Negative.  Negative for chest pain, palpitations and leg swelling.  Gastrointestinal: Negative.   Genitourinary: Negative.   Musculoskeletal: Negative.   Skin: Negative.   Neurological: Positive for headaches.    Allergies as of 01/11/2017   No Known Allergies     Medication List       Accurate as of 01/11/17  8:53 AM. Always use your most recent med list.          amoxicillin 500 MG capsule Commonly known as:  AMOXIL Take 2 capsules (1,000 mg total) by mouth 2 (two) times daily.   esomeprazole 40 MG capsule Commonly known as:  NEXIUM Take 40 mg by mouth daily at 12 noon. Reported on 09/12/2015           Objective:    BP 110/66   Pulse 91   Temp 97.5 F (36.4 C) (Oral)   Ht 5\' 5"  (1.651 m)   Wt 235 lb (106.6 kg)   BMI 39.11 kg/m   No Known Allergies  Physical Exam  Constitutional: She is oriented to person, place, and time. She appears well-developed and well-nourished.  HENT:  Head: Normocephalic and atraumatic.  Right Ear: Tympanic membrane and external ear normal. No middle ear effusion.  Left Ear: Tympanic membrane and external ear normal.  No middle ear effusion.  Nose: Mucosal edema and rhinorrhea present. Right sinus exhibits no maxillary sinus tenderness. Left sinus exhibits no maxillary sinus tenderness.  Mouth/Throat: Uvula is midline. Posterior oropharyngeal erythema present.  Eyes: Conjunctivae and EOM are normal. Pupils are equal, round, and reactive to light. Right eye exhibits no discharge. Left eye exhibits no discharge.  Neck: Normal range of motion.  Cardiovascular: Normal rate, regular rhythm and normal heart sounds.   Pulmonary/Chest: Effort normal and breath sounds normal. No respiratory distress. She has no wheezes.  Abdominal: Soft.  Lymphadenopathy:    She has no cervical adenopathy.  Neurological: She is alert and oriented to person, place, and time.  Skin: Skin is warm and dry.  Psychiatric: She has a normal mood and affect.  Assessment & Plan:   1. Acute non-recurrent maxillary sinusitis - amoxicillin (AMOXIL) 500 MG capsule; Take 2 capsules (1,000 mg total) by mouth 2 (two) times daily.  Dispense: 40 capsule; Refill: 0   Current Outpatient Prescriptions:  .  esomeprazole (NEXIUM) 40 MG capsule, Take 40 mg by mouth daily at 12 noon. Reported on 09/12/2015, Disp: , Rfl:  .  amoxicillin (AMOXIL) 500 MG capsule, Take 2 capsules (1,000 mg total) by mouth 2 (two) times daily., Disp: 40 capsule, Rfl: 0  Continue all other maintenance medications as listed above.  Follow up plan: Return if symptoms worsen or fail to  improve.  Educational handout given for sinusitis  Remus Loffler PA-C [facility] [address]  [place], [place] [address] [phone]   01/11/2017, 8:53 AM

## 2017-01-11 NOTE — Patient Instructions (Signed)

## 2017-01-14 ENCOUNTER — Telehealth: Payer: Self-pay | Admitting: Physician Assistant

## 2017-01-14 MED ORDER — CEFDINIR 300 MG PO CAPS: 300.0000 mg | ORAL_CAPSULE | Freq: Two times a day (BID) | ORAL | 0 refills | Status: DC

## 2017-01-14 NOTE — Telephone Encounter (Signed)
Patient aware.

## 2017-01-14 NOTE — Telephone Encounter (Signed)
Patient seen Sharon Foster 5/22 and was prescribed amoxil. Patient states she is no better- still having a cough, congestion, sore throat and headache. Patient would like something stronger called in for her. Please advise.

## 2017-02-03 ENCOUNTER — Ambulatory Visit (INDEPENDENT_AMBULATORY_CARE_PROVIDER_SITE_OTHER): Payer: BC Managed Care – PPO | Admitting: Pediatrics

## 2017-02-03 ENCOUNTER — Encounter: Payer: Self-pay | Admitting: Pediatrics

## 2017-02-03 VITALS — BP 122/64 | HR 77 | Temp 98.1°F | Resp 20 | Ht 65.0 in | Wt 236.0 lb

## 2017-02-03 DIAGNOSIS — J40 Bronchitis, not specified as acute or chronic: Secondary | ICD-10-CM | POA: Diagnosis not present

## 2017-02-03 MED ORDER — AZITHROMYCIN 250 MG PO TABS: ORAL_TABLET | ORAL | 0 refills | Status: DC

## 2017-02-03 NOTE — Patient Instructions (Signed)
Take antihistamine daily Use nasal steroid daily  If cough worsening start azithromycin

## 2017-02-03 NOTE — Progress Notes (Signed)
  Subjective:   Patient ID: Sharon Foster, female    DOB: Jul 13, 1976, 41 y.o.   MRN: [id number] CC: Bronchitis and Cough  HPI: Sharon Foster is a 41 y.o. female presenting for Bronchitis and Cough  Treated 3 weeks ago for sinusitis with at first amoxicillin, no improvement after 4 days so switched to cefdinir Completed course Feels much better than when she first got sick Continues to have daily cough Had to pull over 5 days ago due to coughing, felt like there was mucus in her chest she had to cough up Happened again yesterday, couldn't feel better until she coughing up the mucus Mucus has been clear Not taking antihistamines/nasal steroid now Still with some sinus congestion, no nasal drainage No fevers Appetite has been fine Feeling well otherwise No wheezing  Relevant past medical, surgical, family and social history reviewed. Allergies and medications reviewed and updated. History  Smoking Status  . Never Smoker  Smokeless Tobacco  . Never Used   ROS: Per HPI   Objective:    BP 122/64   Pulse 77   Temp 98.1 F (36.7 C) (Oral)   Resp 20   Ht 5\' 5"  (1.651 m)   Wt 236 lb (107 kg)   SpO2 97%   BMI 39.27 kg/m   Wt Readings from Last 3 Encounters:  02/03/17 236 lb (107 kg)  01/11/17 235 lb (106.6 kg)  11/04/16 235 lb (106.6 kg)    Gen: NAD, alert, cooperative with exam, NCAT EYES: EOMI, no conjunctival injection, or no icterus ENT:  TMs pearly gray b/l, OP without erythema LYMPH: no cervical LAD CV: NRRR, normal S1/S2, no murmur, distal pulses 2+ b/l Resp: CTABL, no wheezes, normal WOB Abd: +BS, soft, NTND.  Ext: No edema, warm Neuro: Alert and oriented  Assessment & Plan:  [name] was seen today for bronchitis and cough.  Diagnoses and all orders for this visit:  Bronchitis Cont symptom care No wheezing on exam Start antihistamine, flonase Keep water/lozenges near to avoid coughing If cough worsening start below  Other orders -      azithromycin (ZITHROMAX) 250 MG tablet; Take 2 the first day and then one each day after.   Follow up plan: As needed Rex Krasarol Emmilyn Crooke, MD [facility]

## 2017-05-09 ENCOUNTER — Encounter: Payer: Self-pay | Admitting: Family Medicine

## 2017-05-09 ENCOUNTER — Ambulatory Visit (INDEPENDENT_AMBULATORY_CARE_PROVIDER_SITE_OTHER): Payer: BC Managed Care – PPO | Admitting: Family Medicine

## 2017-05-09 VITALS — BP 106/62 | HR 80 | Temp 98.1°F | Ht 65.0 in | Wt 241.0 lb

## 2017-05-09 DIAGNOSIS — K2100 Gastro-esophageal reflux disease with esophagitis, without bleeding: Secondary | ICD-10-CM

## 2017-05-09 DIAGNOSIS — Z6841 Body Mass Index (BMI) 40.0 and over, adult: Secondary | ICD-10-CM

## 2017-05-09 DIAGNOSIS — R21 Rash and other nonspecific skin eruption: Secondary | ICD-10-CM

## 2017-05-09 MED ORDER — CETIRIZINE HCL 10 MG PO TABS: 10.0000 mg | ORAL_TABLET | Freq: Every day | ORAL | 11 refills | Status: DC

## 2017-05-09 MED ORDER — DEXLANSOPRAZOLE 60 MG PO CPDR: 60.0000 mg | DELAYED_RELEASE_CAPSULE | Freq: Every day | ORAL | 2 refills | Status: DC

## 2017-05-09 MED ORDER — BETAMETHASONE SOD PHOS & ACET 6 (3-3) MG/ML IJ SUSP
6.0000 mg | Freq: Once | INTRAMUSCULAR | Status: AC
  Administered 2017-05-09: 6 mg via INTRAMUSCULAR

## 2017-05-09 MED ORDER — PHENTERMINE HCL 37.5 MG PO CAPS: 37.5000 mg | ORAL_CAPSULE | ORAL | 2 refills | Status: DC

## 2017-05-09 NOTE — Progress Notes (Signed)
Subjective:  Patient ID: Sharon Foster, female    DOB: [birth year]  Age: 41 y.o. MRN: [id number]  CC: Rash (random places x 2 weeks); Heartburn (real bad at night ); and Weight Loss   HPI Sharon Foster presents for patient states that she started a new fabric softener couple weeks ago and she thinks that may cause this rash.On the other hand if she is having it under her arms and on her abdomen which are contacted by clothing. However she's also having breaking out at the ankles. Since it summertime she is not wearing socks and so there is no fabric contacting the ankle area.It is moderately pruritic. She gets some relief with Benadryl but that causes excessive drowsiness.  Patient says that she's taken various PPIs over-the-counter. She's taken as much as four omeprazole a day and still having heartburn every night. It feels like it is bubbling up under her sternal/jugular notch.No nausea vomiting. Symptoms are increasing and getting rather severe. She denies any melena and hematochezia.no history of endoscopy.  Patient realizes she needs to lose some weight. She is beginning to get varicose veins. She understands that the heartburn is made worse by there weight..she wakes up in the night hungry. She'll snack. No plans with regard to following a specified diet program. She just wants some help to relhe urge to snack.  Depression screen [facility] 2/9 05/09/2017 02/03/2017 01/11/2017  Decreased Interest 0 0 0  Down, Depressed, Hopeless 0 0 0  PHQ - 2 Score 0 0 0    History Sharon Foster has a past medical history of GERD (gastroesophageal reflux disease).   She has a past surgical history that includes Breast surgery and LASIK.   Her family history includes Cancer in her father; Hypertension in her father and mother.She reports that she has never smoked. She has never used smokeless tobacco. She reports that she does not drink alcohol or use drugs.    ROS Review of Systems  Constitutional:  Negative for activity change, appetite change and fever.  HENT: Negative for congestion, rhinorrhea and sore throat.   Eyes: Negative for visual disturbance.  Respiratory: Negative for cough and shortness of breath.   Cardiovascular: Negative for chest pain and palpitations.  Gastrointestinal: Positive for abdominal pain. Negative for diarrhea and nausea.  Genitourinary: Negative for dysuria.  Musculoskeletal: Negative for arthralgias and myalgias.  Skin: Positive for rash.    Objective:  BP 106/62 (BP Location: Left Arm)   Pulse 80   Temp 98.1 F (36.7 C) (Oral)   Ht  (1.651 m)   Wt 241 lb (109.3 kg)   BMI 40.10 kg/m   BP Readings from Last 3 Encounters:  05/09/17 106/62  02/03/17 122/64  01/11/17 110/66    Wt Readings from Last 3 Encounters:  05/09/17 241 lb (109.3 kg)  02/03/17 236 lb (107 kg)  01/11/17 235 lb (106.6 kg)     Physical Exam  Constitutional: She is oriented to person, place, and time. She appears well-developed and well-nourished. No distress.  HENT:  Head: Normocephalic and atraumatic.  Right Ear: External ear normal.  Left Ear: External ear normal.  Nose: Nose normal.  Mouth/Throat: Oropharynx is clear and moist.  Eyes: Pupils are equal, round, and reactive to light. Conjunctivae and EOM are normal.  Neck: Normal range of motion. Neck supple. No thyromegaly present.  Cardiovascular: Normal rate, regular rhythm and normal heart sounds.   No murmur heard. Pulmonary/Chest: Effort normal and breath sounds normal. No respiratory  distress. She has no wheezes. She has no rales.  Abdominal: Soft. Bowel sounds are normal. She exhibits no distension. There is tenderness (mmild epigastric to right upper quadrant).  Lymphadenopathy:    She has no cervical adenopathy.  Neurological: She is alert and oriented to person, place, and time. She has normal reflexes.  Skin: Skin is warm and dry. Rash (pale urticarial blanching athe axilla and right flank. Mild  blanching erythematous patches at right ankle as well.) noted.  Psychiatric: She has a normal mood and affect. Her behavior is normal. Judgment and thought content normal.      Assessment & Plan:   [name] was seen today for rash, heartburn and weight loss.  Diagnoses and all orders for this visit:  Rash -     betamethasone acetate-betamethasone sodium phosphate (CELESTONE) injection 6 mg; Inject 1 mL (6 mg total) into the muscle once.  GERD with esophagitis  Class 3 severe obesity due to excess calories without serious comorbidity with body mass index (BMI) of 40.0 to 44.9 in adult [facility])  Other orders -     phentermine 37.5 MG capsule; Take 1 capsule (37.5 mg total) by mouth every morning. -     dexlansoprazole (DEXILANT) 60 MG capsule; Take 1 capsule (60 mg total) by mouth daily. -     cetirizine (ZYRTEC) 10 MG tablet; Take 1 tablet (10 mg total) by mouth daily.       I have discontinued Ms. Tuite's esomeprazole and azithromycin. I am also having her start on phentermine, dexlansoprazole, and cetirizine. We administered betamethasone acetate-betamethasone sodium phosphate.  Allergies as of 05/09/2017   No Known Allergies     Medication List       Accurate as of 05/09/17  5:45 PM. Always use your most recent med list.          cetirizine 10 MG tablet Commonly known as:  ZYRTEC Take 1 tablet (10 mg total) by mouth daily.   dexlansoprazole 60 MG capsule Commonly known as:  DEXILANT Take 1 capsule (60 mg total) by mouth daily.   phentermine 37.5 MG capsule Take 1 capsule (37.5 mg total) by mouth every morning.            Discharge Care Instructions        Start     Ordered   05/09/17 1715  BETAMETHASONE SOD PHOS & ACET 6 (3-3) MG/ML IJ SUSP   Once     05/09/17 1706   05/09/17 0000  phentermine 37.5 MG capsule  BH-each morning     05/09/17 1706   05/09/17 0000  dexlansoprazole (DEXILANT) 60 MG capsule  Daily     05/09/17 1706   05/09/17 0000   cetirizine (ZYRTEC) 10 MG tablet  Daily     05/09/17 1706       Follow-up: Return in about 3 months (around 08/08/2017).  Mechele Claude, M.D.

## 2017-05-16 ENCOUNTER — Telehealth: Payer: Self-pay | Admitting: Nurse Practitioner

## 2017-05-17 ENCOUNTER — Other Ambulatory Visit: Payer: Self-pay | Admitting: Family Medicine

## 2017-05-17 MED ORDER — PHENTERMINE HCL 15 MG PO CAPS: 15.0000 mg | ORAL_CAPSULE | ORAL | 1 refills | Status: DC

## 2017-05-17 NOTE — Telephone Encounter (Signed)
Since starting Phentermine and Dexilant, patient has been experiencing a feeling of "fuzziness" in her head and is experiencing severe leg pains and cramping.  She feels that the "fuzziness" has improved some in the last week.  She also reports she has already lost 10 lbs in the last week.  She would like to know if these symptoms could be coming from either of the new medications and if you recommend she but back on the Phentermine.  They came as capsules so she cannot cut them in half.  Also, she would like for you to change the Dexilant to a generic that is less expensive.  She reports paying $40 for this and cannot afford to do that monthly.  Please advise.

## 2017-05-17 NOTE — Telephone Encounter (Signed)
Dexilant is by far the best medicine in its class. I gave it to her because none of the others is strong enough. If she can use it for 2 months only, then tapering to the milder meds would be appropriate.  Phentermine is almosst certainly the source of the mental fog. I will write for a lower dose. It can be called or she can pick up (whichever office policy allows.)

## 2017-05-17 NOTE — Telephone Encounter (Signed)
The phentermine can do that also. However the rapid weight loss and the fluid loss that goes along with it may be the culprit. Decreasing the dose of phentermine and making sure that she drinks plenty of water should help.

## 2017-05-17 NOTE — Telephone Encounter (Signed)
lmtcb

## 2017-05-17 NOTE — Telephone Encounter (Signed)
Could either of these meds be causing her to have the leg pain/muscle cramps also?

## 2017-05-17 NOTE — Telephone Encounter (Signed)
Pt notified of recommendation Verbalizes understanding  Will pick up RX

## 2017-06-08 ENCOUNTER — Telehealth: Payer: Self-pay | Admitting: Family Medicine

## 2017-06-08 NOTE — Telephone Encounter (Signed)
What is the name of the medication? Dexilant  Have you contacted your pharmacy to request a refill? NO  [facility] [place]   Patient notified that their request is being sent to the clinical staff for review and that they should receive a call once it is complete. If they do not receive a call within 24 hours they can check with their pharmacy or our office.

## 2017-06-08 NOTE — Telephone Encounter (Signed)
Pt aware medication was refilled 05/09/17 with 2 add't refills

## 2017-06-10 NOTE — Telephone Encounter (Signed)
I prefer dexilant if pt can handle the expense,  Because it is better than the others.

## 2017-06-30 NOTE — Telephone Encounter (Signed)
Attempts were made to contact pt without a return call , will close encounter.

## 2017-07-01 ENCOUNTER — Telehealth: Payer: Self-pay | Admitting: Family Medicine

## 2017-07-01 NOTE — Telephone Encounter (Signed)
Let the patient know that Dexilant is not available as a generic.  The closest I can do is give her pantoprazole 40 mg twice daily.  2936-month supply okay.

## 2017-07-01 NOTE — Telephone Encounter (Signed)
LM 07/01/17-jhb

## 2017-07-04 ENCOUNTER — Other Ambulatory Visit: Payer: Self-pay

## 2017-07-04 MED ORDER — PANTOPRAZOLE SODIUM 40 MG PO TBEC: 40.0000 mg | DELAYED_RELEASE_TABLET | Freq: Two times a day (BID) | ORAL | 5 refills | Status: DC

## 2017-07-04 NOTE — Telephone Encounter (Signed)
Spoke with patient. She is ok with changing to another medication. Sent to pharmacy per Dr Darlyn ReadStacks order.

## 2017-08-18 ENCOUNTER — Telehealth: Payer: Self-pay | Admitting: Family Medicine

## 2017-08-18 NOTE — Telephone Encounter (Signed)
Patient aware to make a follow up appointment .

## 2017-08-18 NOTE — Telephone Encounter (Signed)
Attempted to contact patient- mailbox full  

## 2017-08-20 ENCOUNTER — Ambulatory Visit: Payer: BC Managed Care – PPO | Admitting: Physician Assistant

## 2017-08-20 ENCOUNTER — Encounter: Payer: Self-pay | Admitting: Physician Assistant

## 2017-08-20 VITALS — BP 117/65 | HR 74 | Temp 98.8°F | Ht 65.0 in | Wt 220.0 lb

## 2017-08-20 DIAGNOSIS — J0110 Acute frontal sinusitis, unspecified: Secondary | ICD-10-CM

## 2017-08-20 MED ORDER — AMOXICILLIN 500 MG PO CAPS: 1000.0000 mg | ORAL_CAPSULE | Freq: Two times a day (BID) | ORAL | 0 refills | Status: DC

## 2017-08-20 NOTE — Patient Instructions (Signed)
In a few days you may receive a survey in the mail or online from [employer] regarding your visit with us today. Please take a moment to fill this out. Your feedback is very important to our whole office. It can help us better understand your needs as well as improve your experience and satisfaction. Thank you for taking your time to complete it. We care about you.  Peregrine Nolt, PA-C  

## 2017-08-22 NOTE — Progress Notes (Signed)
BP 117/65 (BP Location: Left Arm, Patient Position: Sitting, Cuff Size: Normal)   Pulse 74   Temp 98.8 F (37.1 C) (Oral)   Ht 5\' 5"  (1.651 m)   Wt 220 lb (99.8 kg)   BMI 36.61 kg/m    Subjective:    Patient ID: Sharon Foster, female    DOB: [birth year], 41 y.o.   MRN: [id number]  HPI: Sharon Foster is a 41 y.o. female presenting on 08/20/2017 for Sinusitis (L ear pain, sinus pain/pressure, nasal congestion x 4 days. has used cough drops. )  This patient has had many days of sinus headache and postnasal drainage. There is copious drainage at times. Denies any fever at this time. There has been a history of sinus infections in the past.  No history of sinus surgery. There is cough at night. It has become more prevalent in recent days.   Relevant past medical, surgical, family and social history reviewed and updated as indicated. Allergies and medications reviewed and updated.  Past Medical History:  Diagnosis Date  . GERD (gastroesophageal reflux disease)     Past Surgical History:  Procedure Laterality Date  . BREAST SURGERY    . LASIK      Review of Systems  Constitutional: Positive for chills and fatigue. Negative for activity change and appetite change.  HENT: Positive for congestion, postnasal drip and sore throat.   Eyes: Negative.   Respiratory: Positive for cough and wheezing.   Cardiovascular: Negative.  Negative for chest pain, palpitations and leg swelling.  Gastrointestinal: Negative.   Genitourinary: Negative.   Musculoskeletal: Negative.   Skin: Negative.   Neurological: Positive for headaches.    Allergies as of 08/20/2017   No Known Allergies     Medication List        Accurate as of 08/20/17 11:59 PM. Always use your most recent med list.          amoxicillin 500 MG capsule Commonly known as:  AMOXIL Take 2 capsules (1,000 mg total) by mouth 2 (two) times daily.   cetirizine 10 MG tablet Commonly known as:  ZYRTEC Take 1 tablet  (10 mg total) by mouth daily.   dexlansoprazole 60 MG capsule Commonly known as:  DEXILANT Take 1 capsule (60 mg total) by mouth daily.   pantoprazole 40 MG tablet Commonly known as:  PROTONIX Take 1 tablet (40 mg total) 2 (two) times daily by mouth.   phentermine 15 MG capsule Take 1 capsule (15 mg total) by mouth every morning.          Objective:    BP 117/65 (BP Location: Left Arm, Patient Position: Sitting, Cuff Size: Normal)   Pulse 74   Temp 98.8 F (37.1 C) (Oral)   Ht 5\' 5"  (1.651 m)   Wt 220 lb (99.8 kg)   BMI 36.61 kg/m   No Known Allergies  Physical Exam  Constitutional: She is oriented to person, place, and time. She appears well-developed and well-nourished.  HENT:  Head: Normocephalic and atraumatic.  Right Ear: Tympanic membrane and external ear normal. No middle ear effusion.  Left Ear: Tympanic membrane and external ear normal.  No middle ear effusion.  Nose: Mucosal edema and rhinorrhea present. Right sinus exhibits frontal sinus tenderness. Right sinus exhibits no maxillary sinus tenderness. Left sinus exhibits frontal sinus tenderness. Left sinus exhibits no maxillary sinus tenderness.  Mouth/Throat: Uvula is midline. Posterior oropharyngeal erythema present.  Eyes: Conjunctivae and EOM are normal. Pupils are equal, round, and  reactive to light. Right eye exhibits no discharge. Left eye exhibits no discharge.  Neck: Normal range of motion.  Cardiovascular: Normal rate, regular rhythm and normal heart sounds.  Pulmonary/Chest: Effort normal and breath sounds normal. No respiratory distress. She has no wheezes.  Abdominal: Soft.  Lymphadenopathy:    She has no cervical adenopathy.  Neurological: She is alert and oriented to person, place, and time.  Skin: Skin is warm and dry.  Psychiatric: She has a normal mood and affect.        Assessment & Plan:   1. Acute non-recurrent frontal sinusitis - amoxicillin (AMOXIL) 500 MG capsule; Take 2  capsules (1,000 mg total) by mouth 2 (two) times daily.  Dispense: 40 capsule; Refill: 0    Current Outpatient Medications:  .  amoxicillin (AMOXIL) 500 MG capsule, Take 2 capsules (1,000 mg total) by mouth 2 (two) times daily., Disp: 40 capsule, Rfl: 0 .  cetirizine (ZYRTEC) 10 MG tablet, Take 1 tablet (10 mg total) by mouth daily., Disp: 30 tablet, Rfl: 11 .  dexlansoprazole (DEXILANT) 60 MG capsule, Take 1 capsule (60 mg total) by mouth daily., Disp: 30 capsule, Rfl: 2 .  pantoprazole (PROTONIX) 40 MG tablet, Take 1 tablet (40 mg total) 2 (two) times daily by mouth., Disp: 60 tablet, Rfl: 5 .  phentermine 15 MG capsule, Take 1 capsule (15 mg total) by mouth every morning., Disp: 30 capsule, Rfl: 1 Continue all other maintenance medications as listed above.  Follow up plan: Return if symptoms worsen or fail to improve.  Educational handout given for survey  Remus LofflerAngel S. Izaias Krupka PA-C [facility] [address]  [place], [place] [id number] [phone]   08/22/2017, 9:07 AM

## 2017-08-24 ENCOUNTER — Ambulatory Visit: Payer: BC Managed Care – PPO | Admitting: Family Medicine

## 2017-08-24 ENCOUNTER — Encounter: Payer: Self-pay | Admitting: Family Medicine

## 2017-08-24 VITALS — BP 112/62 | HR 83 | Temp 98.4°F | Ht 65.0 in | Wt 222.0 lb

## 2017-08-24 DIAGNOSIS — Z6841 Body Mass Index (BMI) 40.0 and over, adult: Secondary | ICD-10-CM | POA: Diagnosis not present

## 2017-08-24 DIAGNOSIS — K2100 Gastro-esophageal reflux disease with esophagitis, without bleeding: Secondary | ICD-10-CM

## 2017-08-24 DIAGNOSIS — E66813 Obesity, class 3: Secondary | ICD-10-CM

## 2017-08-24 MED ORDER — PHENTERMINE HCL 15 MG PO CAPS: 15.0000 mg | ORAL_CAPSULE | ORAL | 2 refills | Status: DC

## 2017-08-24 NOTE — Progress Notes (Signed)
Subjective:  Patient ID: Sharon Foster, female    DOB: [birth year]  Age: 42 y.o. MRN: [id number]  CC: Medication Management (pt here today following up on weight loss medication and wants to discuss)   HPI Sharon Foster presents for states that the medicine worked well for her.  Would like to have it renewed.  She denies any side effects.  Particularly only occasional palpitations not associated with chest pain or shortness of breath dizziness or syncope.  Weight loss has been about 20 pounds.  She wants to pursue another 20 pounds over the next few months.  She also hopes that the weight loss will help her with her ongoing problems with reflux.  Patient in for follow-up of GERD. Currently asymptomatic taking  PPI daily. There is no chest pain or heartburn. No hematemesis and no melena. No dysphagia or choking. Onset is remote. Progression is stable. Complicating factors, none.    Depression screen [facility] 2/9 08/30/2017 05/09/2017 02/03/2017  Decreased Interest 0 0 0  Down, Depressed, Hopeless 0 0 0  PHQ - 2 Score 0 0 0    History Sharon Foster has a past medical history of GERD (gastroesophageal reflux disease).   She has a past surgical history that includes Breast surgery and LASIK.   Her family history includes Cancer in her father; Hypertension in her father and mother.She reports that  has never smoked. she has never used smokeless tobacco. She reports that she does not drink alcohol or use drugs.    ROS Review of Systems  Constitutional: Negative for activity change, appetite change and fever.  HENT: Negative for congestion, rhinorrhea and sore throat.   Eyes: Negative for visual disturbance.  Respiratory: Negative for cough and shortness of breath.   Cardiovascular: Negative for chest pain and palpitations.  Gastrointestinal: Negative for abdominal pain, diarrhea and nausea.  Genitourinary: Negative for dysuria.  Musculoskeletal: Negative for arthralgias and myalgias.     Objective:  BP 112/62   Pulse 83   Temp 98.4 F (36.9 C) (Oral)   Ht 5\' 5"  (1.651 m)   Wt 222 lb (100.7 kg)   BMI 36.94 kg/m   BP Readings from Last 3 Encounters:  08/30/17 118/70  08/24/17 112/62  08/20/17 117/65    Wt Readings from Last 3 Encounters:  08/30/17 223 lb (101.2 kg)  08/24/17 222 lb (100.7 kg)  08/20/17 220 lb (99.8 kg)     Physical Exam  Constitutional: She is oriented to person, place, and time. She appears well-developed and well-nourished. No distress.  HENT:  Head: Normocephalic and atraumatic.  Right Ear: External ear normal.  Left Ear: External ear normal.  Nose: Nose normal.  Mouth/Throat: Oropharynx is clear and moist.  Eyes: Conjunctivae and EOM are normal. Pupils are equal, round, and reactive to light.  Neck: Normal range of motion. Neck supple. No thyromegaly present.  Cardiovascular: Normal rate, regular rhythm and normal heart sounds.  No murmur heard. Pulmonary/Chest: Effort normal and breath sounds normal. No respiratory distress. She has no wheezes. She has no rales.  Abdominal: Soft. Bowel sounds are normal. She exhibits no distension. There is no tenderness.  Lymphadenopathy:    She has no cervical adenopathy.  Neurological: She is alert and oriented to person, place, and time. She has normal reflexes.  Skin: Skin is warm and dry.  Psychiatric: She has a normal mood and affect. Her behavior is normal. Judgment and thought content normal.      Assessment & Plan:  Sharon Foster was seen today for medication management.  Diagnoses and all orders for this visit:  GERD with esophagitis  Class 3 severe obesity due to excess calories without serious comorbidity with body mass index (BMI) of 40.0 to 44.9 in adult [facility](HCC)  Other orders -     phentermine 15 MG capsule; Take 1 capsule (15 mg total) by mouth every morning.       I have discontinued Sharon Foster's dexlansoprazole. I am also having her maintain her pantoprazole,  amoxicillin, and phentermine.  Allergies as of 08/24/2017   No Known Allergies     Medication List        Accurate as of 08/24/17 11:59 PM. Always use your most recent med list.          amoxicillin 500 MG capsule Commonly known as:  AMOXIL Take 2 capsules (1,000 mg total) by mouth 2 (two) times daily.   cetirizine 10 MG tablet Commonly known as:  ZYRTEC Take 1 tablet (10 mg total) by mouth daily.   pantoprazole 40 MG tablet Commonly known as:  PROTONIX Take 1 tablet (40 mg total) 2 (two) times daily by mouth.   phentermine 15 MG capsule Take 1 capsule (15 mg total) by mouth every morning.        Follow-up: Return in about 3 months (around 11/22/2017).  Mechele ClaudeWarren Sharon Foster, M.D.

## 2017-08-29 ENCOUNTER — Telehealth: Payer: Self-pay | Admitting: Family Medicine

## 2017-08-29 NOTE — Telephone Encounter (Signed)
Please review and advise.

## 2017-08-29 NOTE — Telephone Encounter (Signed)
lmtcb

## 2017-08-29 NOTE — Telephone Encounter (Signed)
Please clarify this with the patient: In September a week after I prescribed the 37 1/2 mg she called in stating that she was having mental fog and leg cramps.  At that time I reduced her dose and called in the 15 mg pill.  When she followed up last week she said she was doing well with the current dose which I assume it was still the 15 mg pill.  Based on the current telephone conversation am  now to understand that she did not make the switch in September?  Did the mental fog and leg cramps clear?  Is she willing to risk that possibility again if I give her the higher dose?  Let me know her response and I will be glad to send in which ever dose she prefers based on her answers.   Thanks, [employer] CorporationWarren Lillard Bailon

## 2017-08-29 NOTE — Telephone Encounter (Signed)
Pt has been on Phentermine 37.5 and when she went to pick it up at the pharmacy last week the dose was lowered to 15mg . [facility] [place] Pt is wanting to know whit was lowered and why it was not upped because she didn't feel like the 37.5 was working. She stated the 37.5 worked good in the mornings, but in the afternoon it did not work at all. Please advise.

## 2017-08-29 NOTE — Progress Notes (Signed)
Subjective: CC: right breast discomfort PCP: Mechele Claude, MD ZOX:[id number] Sharon Foster is a 42 y.o. female presenting to clinic today for:  1. Breast concern Patient reports a several year history of right-sided breast pain.  She reports that over the last couple of weeks she has had increased tenderness under the inferior portion of her right breast, particularly when she is removing her bra.  She denies nipple discharge, bleeding, skin changes, fevers, unplanned weight loss.  She notes that she is actually 3 cyst removed from the inferior portion of her breast in the past and had a biopsy of the top of the right breast in the past, which was benign.  She has not had a mammogram done in greater than 3 years.  Denies family history of breast cancer, ovarian cancer, prostate cancer or colon cancer.  Patient has an IUD in place.  She has gone to [facility] the past for mammograms.  However, she is interested in going to the [facility] breast clinic for further evaluation.  ROS: Per HPI  No Known Allergies Past Medical History:  Diagnosis Date  . GERD (gastroesophageal reflux disease)     Current Outpatient Medications:  .  amoxicillin (AMOXIL) 500 MG capsule, Take 2 capsules (1,000 mg total) by mouth 2 (two) times daily., Disp: 40 capsule, Rfl: 0 .  pantoprazole (PROTONIX) 40 MG tablet, Take 1 tablet (40 mg total) 2 (two) times daily by mouth., Disp: 60 tablet, Rfl: 5 .  phentermine 15 MG capsule, Take 1 capsule (15 mg total) by mouth every morning., Disp: 30 capsule, Rfl: 2 Social History   Socioeconomic History  . Marital status: Single    Spouse name: Not on file  . Number of children: Not on file  . Years of education: Not on file  . Highest education level: Not on file  Social Needs  . Financial resource strain: Not on file  . Food insecurity - worry: Not on file  . Food insecurity - inability: Not on file  . Transportation needs - medical: Not on file  . Transportation  needs - non-medical: Not on file  Occupational History  . Not on file  Tobacco Use  . Smoking status: Never Smoker  . Smokeless tobacco: Never Used  Substance and Sexual Activity  . Alcohol use: No  . Drug use: No  . Sexual activity: Not on file  Other Topics Concern  . Not on file  Social History Narrative  . Not on file   Family History  Problem Relation Age of Onset  . Hypertension Mother   . Cancer Father        Prostate  . Hypertension Father     Objective: Office vital signs reviewed. BP 118/70   Pulse 73   Ht 5\' 5"  (1.651 m)   Wt 223 lb (101.2 kg)   BMI 37.11 kg/m   Physical Examination:  General: Awake, alert, well nourished, No acute distress Cardio: regular rate and rhythm, +2 DP Pulm: no wheezes, normal work of breathing on room air Breast: Fairly symmetric with the right breast slightly more pendulous than the left, no peau d'orange, no nipple discharge, no puckering, no nipple inversion.  Bilateral breasts are fibrocystic.  There is a cluster of well-circumscribed mobile small masses within the inferior outer quadrant of the right breast.  These are tender to palpation.   Lymph: Bilateral axilla with minimally enlarged lymph nodes.  There does appear to be a dominant lymph node on the right  axilla.  No supraclavicular lymph nodes palpated.   Assessment/ Plan: 42 y.o. female   1. Breast pain, right Likely fibroglandular/cystic in nature.  Given her significant history of breast cysts and need for biopsy in the past, will obtain bilateral diagnostic mammograms with a right-sided breast ultrasound to further evaluate lymph nodes and inferior masses.  [facility] as [facility].  I recommended that she have the results sent over to our office for review and discussion.  For now, may take ibuprofen or Aleve as needed as directed for breast pain.  Reviewed using ice for reduction in irritation.  Follow-up as needed. - MM  Digital Diagnostic Bilat; Future - US BREAST COMPLETE UNI RIGHT INC AXILLA; Future  2. History of cyst of breast - MM Digital Diagnostic Bilat; Future - US BREAST COMPLETE UNI RIGHT INC AXILLA; Future   Orders Placed This Encounter  Procedures  . MM Digital Diagnostic Bilat    Standing Status:   Future    Standing Expiration Date:   10/29/2018    Scheduling Instructions:     [facility]    Order Specific Question:   Reason for Exam (SYMPTOM  OR DIAGNOSIS REQUIRED)    Answer:   right breast pain.    Order Specific Question:   Is the patient pregnant?    Answer:   No    Order Specific Question:   Preferred imaging location?    Answer:   External  . US BREAST COMPLETE UNI RIGHT INC AXILLA    Standing Status:   Future    Standing Expiration Date:   10/29/2018    Scheduling Instructions:     Pref[facility]    Order Specific Question:   Reason for Exam (SYMPTOM  OR DIAGNOSIS REQUIRED)    Answer:   right breast pain. palaple lymph nodes    Order Specific Question:   Preferred imaging location?    Answer:   External    Raliegh IpAshly M Brittiany Wiehe, DO [facility] [phone]

## 2017-08-30 ENCOUNTER — Encounter: Payer: Self-pay | Admitting: Family Medicine

## 2017-08-30 ENCOUNTER — Telehealth: Payer: Self-pay | Admitting: Family Medicine

## 2017-08-30 ENCOUNTER — Ambulatory Visit: Payer: BC Managed Care – PPO | Admitting: Family Medicine

## 2017-08-30 VITALS — BP 118/70 | HR 73 | Ht 65.0 in | Wt 223.0 lb

## 2017-08-30 DIAGNOSIS — N644 Mastodynia: Secondary | ICD-10-CM

## 2017-08-30 DIAGNOSIS — Z872 Personal history of diseases of the skin and subcutaneous tissue: Secondary | ICD-10-CM | POA: Diagnosis not present

## 2017-08-30 NOTE — Telephone Encounter (Signed)
Spoke with pt and called [facility] back to verify mammogram was scheduled correctly. It was, and ultrasound will be done afterwards if needed, but they do not schedule these. Gave pt the correct appointment date/time but she needs to reschedule to a time that her husband can accompany her. Telephone number given to pt to call to make this change

## 2017-08-30 NOTE — Patient Instructions (Signed)
I placed a referral to the [facility] for your mammogram and breast ultrasound.  Please make sure that they send the results to our office.  For your breast pain, we discussed use of ibuprofen 600 mg every 6-8 hours as needed for pain or swelling.  You may also apply ice.   Breast Tenderness Breast tenderness is a common problem for women of all ages. Breast tenderness may cause mild discomfort to severe pain. The pain usually comes and goes in association with your menstrual cycle, but it can be constant. Breast tenderness has many possible causes, including hormone changes and some medicines. Your health care provider may order tests, such as a mammogram or an ultrasound, to check for any unusual findings. Having breast tenderness usually does not mean that you have breast cancer. Follow these instructions at home: Sometimes, reassurance that you do not have breast cancer is all that is needed. In general, follow these home care instructions: Managing pain and discomfort  If directed, apply ice to the area: ? Put ice in a plastic bag. ? Place a towel between your skin and the bag. ? Leave the ice on for 20 minutes, 2-3 times a day.  Make sure you are wearing a supportive bra, especially during exercise. You may also want to wear a supportive bra while sleeping if your breasts are very tender. Medicines  Take over-the-counter and prescription medicines only as told by your health care provider. If the cause of your pain is infection, you may be prescribed an antibiotic medicine.  If you were prescribed an antibiotic, take it as told by your health care provider. Do not stop taking the antibiotic even if you start to feel better. General instructions  Your health care provider may recommend that you reduce the amount of fat in your diet. You can do this by: ? Limiting fried foods. ? Cooking foods using methods, such as baking, boiling, grilling, and broiling.  Decrease the  amount of caffeine in your diet. You can do this by drinking more water and choosing caffeine-free options.  Keep a log of the days and times when your breasts are most tender.  Ask your health care provider how to do breast exams at home. This will help you notice if you have an unusual growth or lump. Contact a health care provider if:  Any part of your breast is hard, red, and hot to the touch. This may be a sign of infection.  You are not breastfeeding and you have fluid, especially blood or pus, coming out of your nipples.  You have a fever.  You have a new or painful lump in your breast that remains after your menstrual period ends.  Your pain does not improve or it gets worse.  Your pain is interfering with your daily activities. This information is not intended to replace advice given to you by your health care provider. Make sure you discuss any questions you have with your health care provider. Document Released: 07/22/2008 Document Revised: 05/07/2016 Document Reviewed: 05/07/2016 Elsevier Interactive Patient Education  [employer]2018 Elsevier Inc.

## 2017-09-05 NOTE — Telephone Encounter (Signed)
Patient seen since phone call.  This encounter will now be closed 

## 2017-09-07 ENCOUNTER — Telehealth: Payer: Self-pay | Admitting: Family Medicine

## 2017-09-07 ENCOUNTER — Other Ambulatory Visit: Payer: Self-pay | Admitting: Family Medicine

## 2017-09-07 MED ORDER — PHENTERMINE HCL 37.5 MG PO CAPS: 37.5000 mg | ORAL_CAPSULE | ORAL | 2 refills | Status: DC

## 2017-09-07 MED ORDER — TOPIRAMATE 25 MG PO TABS: 25.0000 mg | ORAL_TABLET | Freq: Every day | ORAL | 2 refills | Status: DC

## 2017-09-07 NOTE — Telephone Encounter (Signed)
I sent in the requested prescription 

## 2017-09-07 NOTE — Telephone Encounter (Signed)
lmtcb

## 2017-10-11 ENCOUNTER — Telehealth: Payer: Self-pay | Admitting: Family Medicine

## 2017-10-11 MED ORDER — FLUCONAZOLE 150 MG PO TABS: 150.0000 mg | ORAL_TABLET | Freq: Once | ORAL | 0 refills | Status: AC

## 2017-10-11 NOTE — Telephone Encounter (Signed)
Please advise 

## 2017-10-11 NOTE — Telephone Encounter (Signed)
Covering for PCP  Diflucan sent  Sharon SinkSam Aleksis Jiggetts, MD [facility] 10/11/2017, 4:51 PM

## 2017-10-11 NOTE — Telephone Encounter (Signed)
Pt of [facility]. Routed to him but patient would like something today. Please advise

## 2017-10-12 NOTE — Telephone Encounter (Signed)
Aware. 

## 2017-10-14 ENCOUNTER — Telehealth: Payer: Self-pay | Admitting: Family Medicine

## 2017-10-14 MED ORDER — FLUCONAZOLE 150 MG PO TABS: 150.0000 mg | ORAL_TABLET | Freq: Once | ORAL | 0 refills | Status: AC

## 2017-10-14 NOTE — Telephone Encounter (Signed)
Patient called with vaginal itching burning, sent in Diflucan

## 2017-10-14 NOTE — Telephone Encounter (Signed)
What symptoms do you have? Burning and want Diflucan  How long have you been sick? One week  Have you been seen for this problem? NO  If your provider decides to give you a prescription, which pharmacy would you like for it to be sent to[facility] [place]   Patient informed that this information will be sent to the clinical staff for review and that they should receive a follow up call.

## 2017-10-14 NOTE — Telephone Encounter (Signed)
Patient aware.

## 2017-10-14 NOTE — Telephone Encounter (Signed)
Covering PCP- please advise and send to pools.  

## 2017-10-18 ENCOUNTER — Ambulatory Visit: Payer: BC Managed Care – PPO | Admitting: Family Medicine

## 2017-10-18 ENCOUNTER — Encounter: Payer: Self-pay | Admitting: Family Medicine

## 2017-10-18 VITALS — BP 99/65 | HR 106 | Temp 98.4°F | Ht 65.0 in | Wt 205.1 lb

## 2017-10-18 DIAGNOSIS — N921 Excessive and frequent menstruation with irregular cycle: Secondary | ICD-10-CM | POA: Diagnosis not present

## 2017-10-18 DIAGNOSIS — N926 Irregular menstruation, unspecified: Secondary | ICD-10-CM | POA: Diagnosis not present

## 2017-10-18 MED ORDER — MEDROXYPROGESTERONE ACETATE 10 MG PO TABS: 10.0000 mg | ORAL_TABLET | Freq: Every day | ORAL | 0 refills | Status: DC

## 2017-10-18 NOTE — Progress Notes (Signed)
Subjective:  Patient ID: Sharon Foster, female    DOB: 15-Oct-1975  Age: 42 y.o. MRN: [id number]  CC: Metrorrhagia (pt here today c/o bleeding 10-20 days out of the month for the past 6 months to a year and she has Mirena IUD which isn't due to be replaced until August 2020 per pt.)   HPI Nishka S Runnion presents for irregular bleeding. Intermenstrual bleeding noted for several months. Not painful. She had no bleeding for the first three years with the mirena. Denies vaginal DC. No change in sex partner. Denies hot flashes.  Depression screen [facility] 2/9 08/30/2017 05/09/2017 02/03/2017  Decreased Interest 0 0 0  Down, Depressed, Hopeless 0 0 0  PHQ - 2 Score 0 0 0    History Malaisha has a past medical history of GERD (gastroesophageal reflux disease).   She has a past surgical history that includes Breast surgery and LASIK.   Her family history includes Cancer in her father; Hypertension in her father and mother.She reports that  has never smoked. she has never used smokeless tobacco. She reports that she does not drink alcohol or use drugs.    ROS Review of Systems  Constitutional: Negative for activity change, appetite change and fever.  HENT: Negative for congestion, rhinorrhea and sore throat.   Eyes: Negative for visual disturbance.  Respiratory: Negative for cough and shortness of breath.   Cardiovascular: Negative for chest pain and palpitations.  Gastrointestinal: Negative for abdominal pain, diarrhea and nausea.  Genitourinary: Negative for dysuria.  Musculoskeletal: Negative for arthralgias and myalgias.    Objective:  BP 99/65   Pulse (!) 106   Temp 98.4 F (36.9 C) (Oral)   Ht '5\' 5"'  (1.651 m)   Wt 205 lb 2 oz (93 kg)   BMI 34.13 kg/m   BP Readings from Last 3 Encounters:  10/18/17 99/65  08/30/17 118/70  08/24/17 112/62    Wt Readings from Last 3 Encounters:  10/18/17 205 lb 2 oz (93 kg)  08/30/17 223 lb (101.2 kg)  08/24/17 222 lb (100.7 kg)      Physical Exam  Constitutional: She is oriented to person, place, and time. She appears well-developed and well-nourished.  HENT:  Head: Normocephalic and atraumatic.  Cardiovascular: Normal rate and regular rhythm.  No murmur heard. Pulmonary/Chest: Effort normal and breath sounds normal.  Abdominal: Soft. Bowel sounds are normal. She exhibits no mass. There is tenderness (mild suprapubic). There is no rebound and no guarding.  Neurological: She is alert and oriented to person, place, and time.  Skin: Skin is warm and dry.  Psychiatric: She has a normal mood and affect. Her behavior is normal.      Assessment & Plan:   Yanessa was seen today for metrorrhagia.  Diagnoses and all orders for this visit:  Metrorrhagia  Abnormal menses -     CBC with Differential/Platelet; Future -     CMP14+EGFR; Future -     TSH; Future -     Urinalysis; Future  Other orders -     medroxyPROGESTERone (PROVERA) 10 MG tablet; Take 1 tablet (10 mg total) by mouth daily.       I have discontinued Phoenix S. Eslinger's amoxicillin and topiramate. I am also having her start on medroxyPROGESTERone. Additionally, I am having her maintain her pantoprazole and phentermine.  Allergies as of 10/18/2017   No Known Allergies     Medication List        Accurate as of 10/18/17 11:59 PM.  Always use your most recent med list.          medroxyPROGESTERone 10 MG tablet Commonly known as:  PROVERA Take 1 tablet (10 mg total) by mouth daily.   pantoprazole 40 MG tablet Commonly known as:  PROTONIX Take 1 tablet (40 mg total) 2 (two) times daily by mouth.   phentermine 37.5 MG capsule Take 1 capsule (37.5 mg total) by mouth every morning.      If bleeding doesn't stop with provera challenge, she will need to see GYN to consider removal of the mirena  Follow-up: Return if symptoms worsen or fail to improve.  Claretta Fraise, M.D.

## 2017-10-19 ENCOUNTER — Other Ambulatory Visit: Payer: BC Managed Care – PPO

## 2017-10-19 DIAGNOSIS — N926 Irregular menstruation, unspecified: Secondary | ICD-10-CM

## 2017-10-19 LAB — URINALYSIS
Bilirubin, UA: NEGATIVE
GLUCOSE, UA: NEGATIVE
LEUKOCYTES UA: NEGATIVE
NITRITE UA: NEGATIVE
RBC UA: NEGATIVE
SPEC GRAV UA: 1.025 (ref 1.005–1.030)
Urobilinogen, Ur: 2 mg/dL — ABNORMAL HIGH (ref 0.2–1.0)
pH, UA: 6 (ref 5.0–7.5)

## 2017-10-20 LAB — CMP14+EGFR
A/G RATIO: 1.9 (ref 1.2–2.2)
ALK PHOS: 64 IU/L (ref 39–117)
ALT: 13 IU/L (ref 0–32)
AST: 18 IU/L (ref 0–40)
Albumin: 4.5 g/dL (ref 3.5–5.5)
BILIRUBIN TOTAL: 0.8 mg/dL (ref 0.0–1.2)
BUN/Creatinine Ratio: 17 (ref 9–23)
BUN: 15 mg/dL (ref 6–24)
CALCIUM: 9.3 mg/dL (ref 8.7–10.2)
CHLORIDE: 102 mmol/L (ref 96–106)
CO2: 23 mmol/L (ref 20–29)
Creatinine, Ser: 0.89 mg/dL (ref 0.57–1.00)
GFR calc Af Amer: 94 mL/min/{1.73_m2} (ref >59)
GFR, EST NON AFRICAN AMERICAN: 81 mL/min/{1.73_m2} (ref >59)
GLOBULIN, TOTAL: 2.4 g/dL (ref 1.5–4.5)
Glucose: 94 mg/dL (ref 65–99)
POTASSIUM: 3.8 mmol/L (ref 3.5–5.2)
Sodium: 141 mmol/L (ref 134–144)
Total Protein: 6.9 g/dL (ref 6.0–8.5)

## 2017-10-20 LAB — CBC WITH DIFFERENTIAL/PLATELET
BASOS ABS: 0 10*3/uL (ref 0.0–0.2)
Basos: 0 %
EOS (ABSOLUTE): 0 10*3/uL (ref 0.0–0.4)
Eos: 1 %
Hematocrit: 42.9 % (ref 34.0–46.6)
Hemoglobin: 14.2 g/dL (ref 11.1–15.9)
IMMATURE GRANS (ABS): 0 10*3/uL (ref 0.0–0.1)
IMMATURE GRANULOCYTES: 0 %
LYMPHS: 31 %
Lymphocytes Absolute: 1.5 10*3/uL (ref 0.7–3.1)
MCH: 32.3 pg (ref 26.6–33.0)
MCHC: 33.1 g/dL (ref 31.5–35.7)
MCV: 98 fL — AB (ref 79–97)
MONOS ABS: 0.3 10*3/uL (ref 0.1–0.9)
Monocytes: 7 %
NEUTROS PCT: 61 %
Neutrophils Absolute: 2.9 10*3/uL (ref 1.4–7.0)
PLATELETS: 185 10*3/uL (ref 150–379)
RBC: 4.39 x10E6/uL (ref 3.77–5.28)
RDW: 13 % (ref 12.3–15.4)
WBC: 4.8 10*3/uL (ref 3.4–10.8)

## 2017-10-20 LAB — TSH: TSH: 2.34 u[IU]/mL (ref 0.450–4.500)

## 2017-10-30 ENCOUNTER — Encounter: Payer: Self-pay | Admitting: Family Medicine

## 2017-11-18 ENCOUNTER — Encounter: Payer: Self-pay | Admitting: Family Medicine

## 2017-11-18 ENCOUNTER — Telehealth: Payer: Self-pay | Admitting: Family Medicine

## 2017-11-18 ENCOUNTER — Ambulatory Visit: Payer: BC Managed Care – PPO | Admitting: Family Medicine

## 2017-11-18 VITALS — BP 112/59 | HR 79 | Temp 97.4°F | Ht 65.0 in | Wt 197.0 lb

## 2017-11-18 DIAGNOSIS — N926 Irregular menstruation, unspecified: Secondary | ICD-10-CM

## 2017-11-18 DIAGNOSIS — N921 Excessive and frequent menstruation with irregular cycle: Secondary | ICD-10-CM | POA: Diagnosis not present

## 2017-11-18 DIAGNOSIS — Z6841 Body Mass Index (BMI) 40.0 and over, adult: Secondary | ICD-10-CM

## 2017-11-18 NOTE — Progress Notes (Signed)
Subjective:  Patient ID: [name], female    DOB: 1976-05-12  Age: 42 y.o. MRN: [id number]  CC: Follow-up (pt here today following up on abnormal bleeding and was started on provera which didn't help )   HPI Barby S Danis presents for recheck of menstrual bleeding. She had a few days of decreased bleeding while taking the provera, but no significant reduction overall. She is having light to moderate daily spotting. No abdominal pain or bloating. Denies fever, dysuria. Pt. Also doing well with weight loss. Wants to continue with phentermine.   Patient in for follow-up of GERD. Currently asymptomatic taking  PPI daily. There is no chest pain or heartburn. No hematemesis and no melena. No dysphagia or choking. Onset is remote. Progression is stable. Complicating factors, none.   Depression screen [facility] 2/9 11/18/2017 08/30/2017 05/09/2017  Decreased Interest 0 0 0  Down, Depressed, Hopeless 0 0 0  PHQ - 2 Score 0 0 0    History Charlie has a past medical history of GERD (gastroesophageal reflux disease).   She has a past surgical history that includes Breast surgery and LASIK.   Her family history includes Cancer in her father; Hypertension in her father and mother.She reports that she has never smoked. She has never used smokeless tobacco. She reports that she does not drink alcohol or use drugs.    ROS Review of Systems  Constitutional: Negative for activity change, appetite change and fever.  HENT: Negative for congestion, rhinorrhea and sore throat.   Eyes: Negative for visual disturbance.  Respiratory: Negative for cough and shortness of breath.   Cardiovascular: Negative for chest pain and palpitations.  Gastrointestinal: Negative for abdominal pain, diarrhea and nausea.  Genitourinary: Negative for dysuria.  Musculoskeletal: Negative for arthralgias and myalgias.    Objective:  BP (!) 112/59   Pulse 79   Temp (!) 97.4 F (36.3 C) (Oral)   Ht 5\' 5"  (1.651 m)    Wt 197 lb (89.4 kg)   BMI 32.78 kg/m   BP Readings from Last 3 Encounters:  11/18/17 (!) 112/59  10/18/17 99/65  08/30/17 118/70    Wt Readings from Last 3 Encounters:  11/18/17 197 lb (89.4 kg)  10/18/17 205 lb 2 oz (93 kg)  08/30/17 223 lb (101.2 kg)     Physical Exam  Constitutional: She is oriented to person, place, and time. She appears well-developed and well-nourished. No distress.  HENT:  Head: Normocephalic and atraumatic.  Right Ear: External ear normal.  Left Ear: External ear normal.  Nose: Nose normal.  Mouth/Throat: Oropharynx is clear and moist.  Eyes: Pupils are equal, round, and reactive to light. Conjunctivae and EOM are normal.  Neck: Normal range of motion. Neck supple.  Cardiovascular: Normal rate, regular rhythm and normal heart sounds.  No murmur heard. Pulmonary/Chest: Effort normal and breath sounds normal.  Abdominal: Soft. Bowel sounds are normal.  Neurological: She is alert and oriented to person, place, and time. She has normal reflexes.  Skin: Skin is warm and dry.  Psychiatric: She has a normal mood and affect. Her behavior is normal.      Assessment & Plan:   Jordin was seen today for follow-up.  Diagnoses and all orders for this visit:  Metrorrhagia  Abnormal menses  Class 3 severe obesity due to excess calories without serious comorbidity with body mass index (BMI) of 40.0 to 44.9 in adult [facility])       I have discontinued Romina S. Fussner's medroxyPROGESTERone.  I am also having her maintain her pantoprazole, phentermine, and levonorgestrel.  Allergies as of 11/18/2017   No Known Allergies     Medication List        Accurate as of 11/18/17 11:59 PM. Always use your most recent med list.          levonorgestrel 20 MCG/24HR IUD Commonly known as:  MIRENA 1 each by Intrauterine route once.   pantoprazole 40 MG tablet Commonly known as:  PROTONIX Take 1 tablet (40 mg total) 2 (two) times daily by mouth.     phentermine 37.5 MG capsule Take 1 capsule (37.5 mg total) by mouth every morning.        Follow-up: No follow-ups on file.  Mechele ClaudeWarren Janissa Bertram, M.D.

## 2017-11-18 NOTE — Telephone Encounter (Signed)
Am I ok to schedule?

## 2017-11-18 NOTE — Telephone Encounter (Signed)
Apt has been scheduled.

## 2017-11-18 NOTE — Telephone Encounter (Signed)
Yes.  Please make sure I have at least 30 minutes, pref 45 mins for this since we are doing a reinsertion too.

## 2017-11-20 ENCOUNTER — Encounter: Payer: Self-pay | Admitting: Family Medicine

## 2017-11-21 ENCOUNTER — Telehealth: Payer: Self-pay | Admitting: Family Medicine

## 2017-11-21 ENCOUNTER — Other Ambulatory Visit: Payer: Self-pay | Admitting: Family Medicine

## 2017-11-21 MED ORDER — FLUCONAZOLE 150 MG PO TABS: 150.0000 mg | ORAL_TABLET | Freq: Once | ORAL | 0 refills | Status: AC

## 2017-11-21 NOTE — Telephone Encounter (Signed)
Pt aware.

## 2017-11-21 NOTE — Telephone Encounter (Signed)
I sent in the requested prescription 

## 2017-11-22 ENCOUNTER — Ambulatory Visit: Payer: BC Managed Care – PPO | Admitting: Family Medicine

## 2017-11-23 ENCOUNTER — Telehealth: Payer: Self-pay | Admitting: Family Medicine

## 2017-11-23 NOTE — Telephone Encounter (Signed)
Patient to discuss Fridays appointment.  I realize that she is scheduled for an IUD removal and possible reinsertion.  After review of her medical charts and recent conversations with PCP with regards to abnormal vaginal bleeding, I am unsure that a IUD reinsertion is a good idea without first having a pelvic ultrasound, particularly if she has been having abnormal/prolonged vaginal bleeding.  Would need to rule out uterine fibroids prior to reinsertion of a new IUD.  Additionally, it appears that she is not due for removal or reinsertion until 2020.  I discussed this with her primary care doctor who notes that the conversation was that IUD removal may be in her best interest with plans for observation to see if vaginal bleeding improved.  There was no recommendation to have it reinserted.  I wanted to discuss this further with the patient to make sure that we are both on the same page with what the expectations were Fridays appointment was.  Should she return my call, please inform her of the above and feel free to send the call through so that I may discuss this with her further.  Nikoleta Dady M. Nadine CountsGottschalk, DO [facility]

## 2017-11-23 NOTE — Progress Notes (Signed)
Subjective: CC: Contraception PCP: Mechele Claude, MD ZOX:[id number] Sharon Foster is a 42 y.o. female presenting to clinic today for:  1. Vaginal irritation Patient reports that vaginal irritation/ burning started earlier this week, after use of a strawberry scented lubricant.  She notes that her husband has a red rash in his genital area as well.  She notes that initially it was irritated and inflamed and that she did have some burning with urination externally.  Denies dysuria, hematuria, fevers, chills. She notes that discharge appears milky.  She denies vaginal odors.  She denies vaginal pruritis, pelvic pain, nausea, vomiting, dyspareunia.  She is sexually active w/ 1 female partner.  Monogamous relationship with her husband.  Contraception: IUD.  No LMP recorded. (Menstrual status: IUD).   2. Abnormal menstrual bleeding Patient presents today for discussion of contraception.  Patient currently has Mirena IUD in place.  She notes that she has had a 2-3-year history of irregular vaginal bleeding.  She notes that typically the vaginal bleeding is not heavy and rarely requires more than a liner to control.  However, it is occurring about two thirds of the month.  Prior to the insertion of Mirena IUD 4 years ago, she had predictable, regular interval of menstruation but did have heavy menstrual cycles.  She notes history of a copper IUD many years ago which made this worse as well.  She has never had a pelvic ultrasound to further evaluate.  Last pap: 05/2017 and was reportedly normal, performed at [facility] department.  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  She has since had normal Pap smears.  3.  Constipation Patient reports that she has had difficulty with constipation all of her life.  She thinks that it has been worse since she has been on the phentermine.  She reports good hydration but thinks perhaps she could do better because she  still has dry mouth related to the medication.  She notes that she has not had a good bowel movement in 6 days.  Her last bowel movement was hard and pellet-like.  She tried using Senokot which caused significant abdominal cramping and pain.  Denies hematochezia, melena, fevers, chills, nausea or vomiting.  She is tolerating oral intake without difficulty.  She actually started MiraLAX on Monday but is only been using half a capful at a time this has not resulted in a bowel movement.  ROS: Per HPI  No Known Allergies Past Medical History:  Diagnosis Date  . GERD (gastroesophageal reflux disease)     Current Outpatient Medications:  .  levonorgestrel (MIRENA) 20 MCG/24HR IUD, 1 each by Intrauterine route once., Disp: , Rfl:  .  pantoprazole (PROTONIX) 40 MG tablet, Take 1 tablet (40 mg total) 2 (two) times daily by mouth., Disp: 60 tablet, Rfl: 5 .  phentermine 37.5 MG capsule, Take 1 capsule (37.5 mg total) by mouth every morning., Disp: 30 capsule, Rfl: 2 Social History   Socioeconomic History  . Marital status: Single    Spouse name: Not on file  . Number of children: Not on file  . Years of education: Not on file  . Highest education level: Not on file  Occupational History  . Not on file  Social Needs  . Financial resource strain: Not on file  . Food insecurity:    Worry: Not on file    Inability: Not on file  . Transportation needs:    Medical: Not on  file    Non-medical: Not on file  Tobacco Use  . Smoking status: Never Smoker  . Smokeless tobacco: Never Used  Substance and Sexual Activity  . Alcohol use: No  . Drug use: No  . Sexual activity: Not on file  Lifestyle  . Physical activity:    Days per week: Not on file    Minutes per session: Not on file  . Stress: Not on file  Relationships  . Social connections:    Talks on phone: Not on file    Gets together: Not on file    Attends religious service: Not on file    Active member of club or organization: Not on  file    Attends meetings of clubs or organizations: Not on file    Relationship status: Not on file  . Intimate partner violence:    Fear of current or ex partner: Not on file    Emotionally abused: Not on file    Physically abused: Not on file    Forced sexual activity: Not on file  Other Topics Concern  . Not on file  Social History Narrative  . Not on file   Family History  Problem Relation Age of Onset  . Hypertension Mother   . Cancer Father        Prostate  . Hypertension Father     Objective: Office vital signs reviewed. BP (!) 107/52   Pulse 71   Temp (!) 97.2 F (36.2 C) (Oral)   Ht 5\' 5"  (1.651 m)   Wt 196 lb (88.9 kg)   BMI 32.62 kg/m   Physical Examination:  General: Awake, alert, well nourished, nontoxic appearing, No acute distress HEENT: Normal, MMM, no conjunctival pallor Cardio: regular rate, +2 DP Pulm: normal work of breathing on room air GU: external vaginal tissue with slight inflammation along the labia majora, cervix off to the patient'Sharon right, no punctate lesions on cervix appreciated, scant bloody discharge from cervical os,  no cervical motion tenderness, no palpable abdominal/ adnexal masses Extremities: warm, well perfused, No edema, cyanosis or clubbing; +2 pulses bilaterally  Assessment/ Plan: 42 y.o. female   1. Vaginal irritation I suspect the vaginal irritation is likely secondary to recent change in lubricant.  I did recommend that she use a non-scented, water based lubricant to reduce risk of allergic reaction.  There are no genital lesions that suggested herpes outbreak or STD at this time.  I did send wet prep and STD cultures for completion. - WET PREP FOR TRICH, YEAST, CLUE - GC/Chlamydia Probe Amp([facility])  2. Abnormal uterine bleeding This is been ongoing for quite some time.  It does not appear that she is ever had a pelvic ultrasound to further evaluate.  It seemingly started with the use of the Mirena IUD.  She had labs  performed recently in February which were unremarkable.  No evidence of anemia or abnormal liver function at that time.  Thyroid function was within normal limits.  Could consider PT/INR to further evaluate but does not sound like she is having bleeding or bruising elsewhere.  Will check pelvic ultrasound to further evaluate uterus and look for fibroids or other findings.  We discussed that we may consider referral to GYN to assist as better in this chronic issue for this patient. - US Pelvis Complete; Future - US Transvaginal Non-OB; Future  3. Drug-induced constipation It sounds like she has had a chronic constipation that seems to be worse with use of phentermine.  I  did encourage her to increase p.o. fluids.  I have also given her MiraLAX cleanout handout.  Increase fiber.  Return precautions discussed.  Follow-up as needed.   Orders Placed This Encounter  Procedures  . WET PREP FOR TRICH, YEAST, CLUE  . GC/Chlamydia Probe Amp([facility])  . US Pelvis Complete    Standing Status:   Future    Standing Expiration Date:   01/26/2019    Scheduling Instructions:     Prefers late afternoon if possible    Order Specific Question:   Reason for Exam (SYMPTOM  OR DIAGNOSIS REQUIRED)    Answer:   abnormal uterine bleeding    Order Specific Question:   Preferred imaging location?    Answer:   [facility]  . US Transvaginal Non-OB    Standing Status:   Future    Standing Expiration Date:   01/26/2019    Order Specific Question:   Reason for Exam (SYMPTOM  OR DIAGNOSIS REQUIRED)    Answer:   abnormal uterine bleeding    Order Specific Question:   Preferred imaging location?    Answer:   [facility]   Meds ordered this encounter  Medications  . polyethylene glycol powder (GLYCOLAX/MIRALAX) powder    Sig: Take 17 g by mouth 2 (two) times daily as needed for moderate constipation.    Dispense:  250 g    Refill:  1     Quintavious Rinck Hulen Skains, DO [facility] [phone]

## 2017-11-25 ENCOUNTER — Ambulatory Visit: Payer: BC Managed Care – PPO | Admitting: Family Medicine

## 2017-11-25 ENCOUNTER — Encounter: Payer: Self-pay | Admitting: Family Medicine

## 2017-11-25 VITALS — BP 107/52 | HR 71 | Temp 97.2°F | Ht 65.0 in | Wt 196.0 lb

## 2017-11-25 DIAGNOSIS — N898 Other specified noninflammatory disorders of vagina: Secondary | ICD-10-CM

## 2017-11-25 DIAGNOSIS — N939 Abnormal uterine and vaginal bleeding, unspecified: Secondary | ICD-10-CM | POA: Diagnosis not present

## 2017-11-25 DIAGNOSIS — K5903 Drug induced constipation: Secondary | ICD-10-CM | POA: Insufficient documentation

## 2017-11-25 LAB — WET PREP FOR TRICH, YEAST, CLUE
Clue Cell Exam: NEGATIVE
Trichomonas Exam: NEGATIVE
Yeast Exam: NEGATIVE

## 2017-11-25 MED ORDER — POLYETHYLENE GLYCOL 3350 17 GM/SCOOP PO POWD
17.0000 g | Freq: Two times a day (BID) | ORAL | 1 refills | Status: DC | PRN
Start: 2017-11-25 — End: 2019-06-27

## 2017-11-25 NOTE — Patient Instructions (Addendum)
I will contact you with the results of your swabs.  I think that the irritation you are experiencing externally is likely a chemical dermatitis related to the lubricant.  Use a non-scented water based lubricant to reduce risks of allergic reaction.  I have ordered a pelvic ultrasound to be done at [facility].  You will be contacted to schedule this.  Thank you for coming in to clinic today.  1. Your symptoms are consistent with Constipation, likely cause of your General Abdominal Pain / Cramping. 2. Start with Miralax sent to pharmacy. First dose 68g (4 capfuls) in 32oz water over 1 to 2 hours for clean out. Next day start 17g or 1 capful daily, may adjust dose up or down by half a capful every few days. Recommend to take this medicine daily for next 1-2 weeks, then may need to use it longer if needed. - Goal is to have soft regular bowel movement 1-3x daily, if too runny or diarrhea, then reduce dose of the medicine  Improve water intake, hydration will help Also recommend increased vegetables, fruits, fiber intake Can try daily Metamucil or Fiber supplement at pharmacy over the counter  Follow-up if symptoms are not improving with bowel movements, or if pain worsens, develop fevers, nausea, vomiting.  Please schedule a follow-up appointment in 1 month to follow-up Constipation  If you have any other questions or concerns, please feel free to call the clinic to contact me. You may also schedule an earlier appointment if necessary.  However, if your symptoms get significantly worse, please go to the Emergency Department to seek immediate medical attention.   Menorrhagia Menorrhagia is a condition in which menstrual periods are heavy or last longer than normal. With menorrhagia, most periods a woman has may cause enough blood loss and cramping that she becomes unable to take part in her usual activities. What are the causes? Common causes of this condition include:  Noncancerous growths in  the uterus (polyps or fibroids).  An imbalance of the estrogen and progesterone hormones.  One of the ovaries not releasing an egg during one or more months.  A problem with the thyroid gland (hypothyroid).  Side effects of having an intrauterine device (IUD).  Side effects of some medicines, such as anti-inflammatory medicines or blood thinners.  A bleeding disorder that stops the blood from clotting normally.  In some cases, the cause of this condition is not known. What are the signs or symptoms? Symptoms of this condition include:  Routinely having to change your pad or tampon every 1-2 hours because it is completely soaked.  Needing to use pads and tampons at the same time because of heavy bleeding.  Needing to wake up to change your pads or tampons during the night.  Passing blood clots larger than 1 inch (2.5 cm) in size.  Having bleeding that lasts for more than 7 days.  Having symptoms of low iron levels (anemia), such as tiredness, fatigue, or shortness of breath.  How is this diagnosed? This condition may be diagnosed based on:  A physical exam.  Your symptoms and menstrual history.  Tests, such as: ? Blood tests to check if you are pregnant or have hormonal changes, a bleeding or thyroid disorder, anemia, or other problems. ? Pap test to check for cancerous changes, infections, or inflammation. ? Endometrial biopsy. This test involves removing a tissue sample from the lining of the uterus (endometrium) to be examined under a microscope. ? Pelvic ultrasound. This test uses sound  waves to create images of your uterus, ovaries, and vagina. The images can show if you have fibroids or other growths. ? Hysteroscopy. For this test, a small telescope is used to look inside your uterus.  How is this treated? Treatment may not be needed for this condition. If it is needed, the best treatment for you will depend on:  Whether you need to prevent pregnancy.  Your  desire to have children in the future.  The cause and severity of your bleeding.  Your personal preference.  Medicines are the first step in treatment. You may be treated with:  Hormonal birth control methods. These treatments reduce bleeding during your menstrual period. They include: ? Birth control pills. ? Skin patch. ? Vaginal ring. ? Shots (injections) that you get every 3 months. ? Hormonal IUD (intrauterine device). ? Implants that go under the skin.  Medicines that thicken blood and slow bleeding.  Medicines that reduce swelling, such as ibuprofen.  Medicines that contain an artificial (synthetic) hormone called progestin.  Medicines that make the ovaries stop working for a short time.  Iron supplements to treat anemia.  If medicines do not work, surgery may be done. Surgical options may include:  Dilation and curettage (D&C). In this procedure, your health care provider opens (dilates) your cervix and then scrapes or suctions tissue from the endometrium to reduce menstrual bleeding.  Operative hysteroscopy. In this procedure, a small tube with a light on the end (hysteroscope) is used to view your uterus and help remove polyps that may be causing heavy periods.  Endometrial ablation. This is when various techniques are used to permanently destroy your entire endometrium. After endometrial ablation, most women have little or no menstrual flow. This procedure reduces your ability to become pregnant.  Endometrial resection. In this procedure, an electrosurgical wire loop is used to remove the endometrium. This procedure reduces your ability to become pregnant.  Hysterectomy. This is surgical removal of the uterus. This is a permanent procedure that stops menstrual periods. Pregnancy is not possible after a hysterectomy.  Follow these instructions at home: Medicines  Take over-the-counter and prescription medicines exactly as told by your health care provider. This  includes iron pills.  Do not change or switch medicines without asking your health care provider.  Do not take aspirin or medicines that contain aspirin 1 week before or during your menstrual period. Aspirin may make bleeding worse. General instructions  If you need to change your sanitary pad or tampon more than once every 2 hours, limit your activity until the bleeding stops.  Iron pills can cause constipation. To prevent or treat constipation while you are taking prescription iron supplements, your health care provider may recommend that you: ? Drink enough fluid to keep your urine clear or pale yellow. ? Take over-the-counter or prescription medicines. ? Eat foods that are high in fiber, such as fresh fruits and vegetables, whole grains, and beans. ? Limit foods that are high in fat and processed sugars, such as fried and sweet foods.  Eat well-balanced meals, including foods that are high in iron. Foods that have a lot of iron include leafy green vegetables, meat, liver, eggs, and whole grain breads and cereals.  Do not try to lose weight until the abnormal bleeding has stopped and your blood iron level is back to normal. If you need to lose weight, work with your health care provider to lose weight safely.  Keep all follow-up visits as told by your health care provider.  This is important. Contact a health care provider if:  You soak through a pad or tampon every 1 or 2 hours, and this happens every time you have a period.  You need to use pads and tampons at the same time because you are bleeding so much.  You have nausea, vomiting, diarrhea, or other problems related to medicines you are taking. Get help right away if:  You soak through more than a pad or tampon in 1 hour.  You pass clots bigger than 1 inch (2.5 cm) wide.  You feel short of breath.  You feel like your heart is beating too fast.  You feel dizzy or faint.  You feel very weak or  tired. Summary  Menorrhagia is a condition in which menstrual periods are heavy or last longer than normal.  Treatment will depend on the cause of the condition and may include medicines or procedures.  Take over-the-counter and prescription medicines exactly as told by your health care provider. This includes iron pills.  Get help right away if you have heavy bleeding that soaks through more than a pad or tampon in 1 hour, you are passing large clots, or you feel dizzy, faint or short of breath. This information is not intended to replace advice given to you by your health care provider. Make sure you discuss any questions you have with your health care provider. Document Released: 08/09/2005 Document Revised: 08/02/2016 Document Reviewed: 08/02/2016 Elsevier Interactive Patient Education  2018 [employer]   Abnormal Uterine Bleeding Abnormal uterine bleeding means bleeding more than usual from your uterus. It can include:  Bleeding between periods.  Bleeding after sex.  Bleeding that is heavier than normal.  Periods that last longer than usual.  Bleeding after you have stopped having your period (menopause).  There are many problems that may cause this. You should see a doctor for any kind of bleeding that is not normal. Treatment depends on the cause of the bleeding. Follow these instructions at home:  Watch your condition for any changes.  Do not use tampons, douche, or have sex, if your doctor tells you not to.  Change your pads often.  Get regular well-woman exams. Make sure they include a pelvic exam and cervical cancer screening.  Keep all follow-up visits as told by your doctor. This is important. Contact a doctor if:  The bleeding lasts more than one week.  You feel dizzy at times.  You feel like you are going to throw up (nauseous).  You throw up. Get help right away if:  You pass out.  You have to change pads every hour.  You have belly  (abdominal) pain.  You have a fever.  You get sweaty.  You get weak.  You passing large blood clots from your vagina. Summary  Abnormal uterine bleeding means bleeding more than usual from your uterus.  There are many problems that may cause this. You should see a doctor for any kind of bleeding that is not normal.  Treatment depends on the cause of the bleeding. This information is not intended to replace advice given to you by your health care provider. Make sure you discuss any questions you have with your health care provider. Document Released: 06/06/2009 Document Revised: 08/03/2016 Document Reviewed: 08/03/2016 Elsevier Interactive Patient Education  2017 [employer].

## 2017-11-29 LAB — GC/CHLAMYDIA PROBE AMP
CHLAMYDIA, DNA PROBE: NEGATIVE
NEISSERIA GONORRHOEAE BY PCR: NEGATIVE

## 2017-11-30 ENCOUNTER — Ambulatory Visit (HOSPITAL_COMMUNITY): Payer: BC Managed Care – PPO

## 2017-12-31 ENCOUNTER — Other Ambulatory Visit: Payer: Self-pay | Admitting: Family Medicine

## 2018-01-02 ENCOUNTER — Telehealth: Payer: Self-pay | Admitting: Family Medicine

## 2018-01-02 NOTE — Telephone Encounter (Signed)
Pt is asking about rx for phentermine and she is aware you are out of the office until tomorrow.

## 2018-01-02 NOTE — Telephone Encounter (Signed)
wanting to speak to Sharon Foster about her phentermine 37.5 MG capsule  said that stacks was suppose to post date a refill on it and she hasn't heard anything about it, said this was suppose to be about a month ago

## 2018-01-02 NOTE — Telephone Encounter (Signed)
Please advise 

## 2018-01-03 NOTE — Telephone Encounter (Signed)
There is a pending rx for Phentermine in your inbasket for refill requests. I will let the pt know you approved it, please just sign it off.      Left message on pt voicemail stating requested rx approved but would ntbs for vaginal itching pt was complaining about.

## 2018-01-03 NOTE — Telephone Encounter (Signed)
Please refill for 1 month 

## 2018-01-06 ENCOUNTER — Encounter: Payer: Self-pay | Admitting: Physician Assistant

## 2018-01-06 ENCOUNTER — Ambulatory Visit: Payer: BC Managed Care – PPO | Admitting: Physician Assistant

## 2018-01-06 VITALS — BP 121/76 | HR 93 | Temp 97.8°F | Ht 65.0 in | Wt 195.4 lb

## 2018-01-06 DIAGNOSIS — J019 Acute sinusitis, unspecified: Secondary | ICD-10-CM | POA: Diagnosis not present

## 2018-01-06 DIAGNOSIS — J029 Acute pharyngitis, unspecified: Secondary | ICD-10-CM

## 2018-01-06 LAB — RAPID STREP SCREEN (MED CTR MEBANE ONLY): STREP GP A AG, IA W/REFLEX: NEGATIVE

## 2018-01-06 LAB — CULTURE, GROUP A STREP

## 2018-01-06 MED ORDER — AMOXICILLIN 500 MG PO CAPS: 500.0000 mg | ORAL_CAPSULE | Freq: Three times a day (TID) | ORAL | 0 refills | Status: DC

## 2018-01-06 MED ORDER — FLUCONAZOLE 150 MG PO TABS: ORAL_TABLET | ORAL | 0 refills | Status: DC

## 2018-01-06 NOTE — Progress Notes (Signed)
BP 121/76   Pulse 93   Temp 97.8 F (36.6 C) (Oral)   Ht  (1.651 m)   Wt 195 lb 6.4 oz (88.6 kg)   BMI 32.52 kg/m    Subjective:    Patient ID: Sharon Foster, female    DOB: [birth year], 42 y.o.   MRN: [id number]  HPI: Sharon Foster is a 42 y.o. female presenting on 01/06/2018 for Nasal Congestion (x 2 days); Sore Throat; Cough; and Ear Pain (bilateral)  This patient has had many days of sinus headache and postnasal drainage. There is copious drainage at times. Denies any fever at this time. There has been a history of sinus infections in the past.  No history of sinus surgery. There is cough at night. It has become more prevalent in recent days.   Past Medical History:  Diagnosis Date  . GERD (gastroesophageal reflux disease)    Relevant past medical, surgical, family and social history reviewed and updated as indicated. Interim medical history since our last visit reviewed. Allergies and medications reviewed and updated. DATA REVIEWED: CHART IN EPIC  Family History reviewed for pertinent findings.  Review of Systems  Constitutional: Positive for chills and fatigue. Negative for activity change and appetite change.  HENT: Positive for congestion, postnasal drip and sore throat.   Eyes: Negative.   Respiratory: Positive for cough and wheezing.   Cardiovascular: Negative.  Negative for chest pain, palpitations and leg swelling.  Gastrointestinal: Negative.   Genitourinary: Negative.   Musculoskeletal: Negative.   Skin: Negative.   Neurological: Positive for headaches.    Allergies as of 01/06/2018   No Known Allergies     Medication List        Accurate as of 01/06/18 11:59 PM. Always use your most recent med list.          amoxicillin 500 MG capsule Commonly known as:  AMOXIL Take 1 capsule (500 mg total) by mouth 3 (three) times daily.   fluconazole 150 MG tablet Commonly known as:  DIFLUCAN 1 po q week x 4 weeks   levonorgestrel 20 MCG/24HR  IUD Commonly known as:  MIRENA 1 each by Intrauterine route once.   pantoprazole 40 MG tablet Commonly known as:  PROTONIX Take 1 tablet (40 mg total) 2 (two) times daily by mouth.   phentermine 37.5 MG capsule TAKE 1 CAPSULE (37.5 MG TOTAL) BY MOUTH EVERY MORNING.   polyethylene glycol powder powder Commonly known as:  GLYCOLAX/MIRALAX Take 17 g by mouth 2 (two) times daily as needed for moderate constipation.          Objective:    BP 121/76   Pulse 93   Temp 97.8 F (36.6 C) (Oral)   Ht  (1.651 m)   Wt 195 lb 6.4 oz (88.6 kg)   BMI 32.52 kg/m   No Known Allergies  Wt Readings from Last 3 Encounters:  01/06/18 195 lb 6.4 oz (88.6 kg)  11/25/17 196 lb (88.9 kg)  11/18/17 197 lb (89.4 kg)    Physical Exam  Constitutional: She is oriented to person, place, and time. She appears well-developed and well-nourished.  HENT:  Head: Normocephalic and atraumatic.  Right Ear: Tympanic membrane and external ear normal. No middle ear effusion.  Left Ear: Tympanic membrane and external ear normal.  No middle ear effusion.  Nose: Mucosal edema and rhinorrhea present. Right sinus exhibits no maxillary sinus tenderness. Left sinus exhibits no maxillary sinus tenderness.  Mouth/Throat: Uvula is midline. Posterior  oropharyngeal erythema present.  Eyes: Pupils are equal, round, and reactive to light. Conjunctivae and EOM are normal. Right eye exhibits no discharge. Left eye exhibits no discharge.  Neck: Normal range of motion.  Cardiovascular: Normal rate, regular rhythm and normal heart sounds.  Pulmonary/Chest: Effort normal and breath sounds normal. No respiratory distress. She has no wheezes.  Abdominal: Soft.  Lymphadenopathy:    She has no cervical adenopathy.  Neurological: She is alert and oriented to person, place, and time.  Skin: Skin is warm and dry.  Psychiatric: She has a normal mood and affect.    Results for orders placed or performed in visit on 01/06/18    Rapid Strep Screen ([facility] ONLY)  Result Value Ref Range   Strep Gp A Ag, IA W/Reflex Negative Negative  Culture, Group A Strep  Result Value Ref Range   Strep A Culture CANCELED       Assessment & Plan:   1. Sore throat - Rapid Strep Screen ([facility] ONLY) - Culture, Group A Strep  2. Acute non-recurrent sinusitis, unspecified location - amoxicillin (AMOXIL) 500 MG capsule; Take 1 capsule (500 mg total) by mouth 3 (three) times daily.  Dispense: 30 capsule; Refill: 0 - fluconazole (DIFLUCAN) 150 MG tablet; 1 po q week x 4 weeks  Dispense: 4 tablet; Refill: 0   Continue all other maintenance medications as listed above.  Follow up plan: No follow-ups on file.  Educational handout given for survey  Remus Loffler PA-C [facility] [address]  [place], [place] [address] [phone]   01/08/2018, 9:28 PM

## 2018-01-21 ENCOUNTER — Telehealth: Payer: Self-pay | Admitting: Family Medicine

## 2018-01-23 ENCOUNTER — Other Ambulatory Visit: Payer: Self-pay | Admitting: Physician Assistant

## 2018-01-23 ENCOUNTER — Telehealth: Payer: Self-pay | Admitting: Family Medicine

## 2018-01-23 MED ORDER — DOXYCYCLINE HYCLATE 100 MG PO TABS: 100.0000 mg | ORAL_TABLET | Freq: Two times a day (BID) | ORAL | 0 refills | Status: DC

## 2018-01-23 NOTE — Telephone Encounter (Signed)
Please advise 

## 2018-01-23 NOTE — Telephone Encounter (Signed)
Patient aware, per messages left,  script is ready.

## 2018-01-23 NOTE — Telephone Encounter (Signed)
Note sent to provider 

## 2018-01-23 NOTE — Telephone Encounter (Signed)
Sent doxycycline to the pharmacy. 

## 2018-01-27 ENCOUNTER — Encounter: Payer: Self-pay | Admitting: Family Medicine

## 2018-01-27 ENCOUNTER — Ambulatory Visit: Payer: BC Managed Care – PPO | Admitting: Family Medicine

## 2018-01-27 VITALS — BP 120/71 | HR 88 | Wt 201.0 lb

## 2018-01-27 DIAGNOSIS — Z3009 Encounter for other general counseling and advice on contraception: Secondary | ICD-10-CM | POA: Diagnosis not present

## 2018-01-27 DIAGNOSIS — R110 Nausea: Secondary | ICD-10-CM | POA: Diagnosis not present

## 2018-01-27 DIAGNOSIS — O210 Mild hyperemesis gravidarum: Secondary | ICD-10-CM

## 2018-01-27 LAB — PREGNANCY, URINE: Preg Test, Ur: NEGATIVE

## 2018-01-27 NOTE — Progress Notes (Signed)
Subjective: CC: "Morning sickness" PCP: Mechele Claude, MD ZOX:[id number] S Dolin is a 42 y.o. female presenting to clinic today for:  1.  Nausea/contraception counseling Patient reports that she has had morning sickness since Tuesday.  She notes that last week, she went to have her IUD removed and replaced and that the replacement was unsuccessful secondary to significant pain causing presyncopal episode.  She also reports that she was placed on OCPs but has been taking an antibiotic for a sinus infection.  She worries that she has become pregnant because she has been combining OCPs with antibiotics.  She was taking doxycycline twice a day.  She would like to explore other options for contraception.  She was previously on Depo-Provera but thinks that perhaps she may have gained a little weight with this.  No history of clotting disorder, migraine headaches, fibroids.  She reports that she has been in talks with her OB/GYN and that ablation and BTL has been discussed but that she would like to hold off for a couple of years prior to doing this.  She has not yet started having vaginal bleeding since being placed on the oral contraceptive pills.  She notes that she was actually having spotting several times a month on the IUD.   ROS: Per HPI  No Known Allergies Past Medical History:  Diagnosis Date  . GERD (gastroesophageal reflux disease)     Current Outpatient Medications:  .  amoxicillin (AMOXIL) 500 MG capsule, Take 1 capsule (500 mg total) by mouth 3 (three) times daily., Disp: 30 capsule, Rfl: 0 .  doxycycline (VIBRA-TABS) 100 MG tablet, Take 1 tablet (100 mg total) by mouth 2 (two) times daily. 1 po bid, Disp: 20 tablet, Rfl: 0 .  fluconazole (DIFLUCAN) 150 MG tablet, 1 po q week x 4 weeks, Disp: 4 tablet, Rfl: 0 .  levonorgestrel (MIRENA) 20 MCG/24HR IUD, 1 each by Intrauterine route once., Disp: , Rfl:  .  pantoprazole (PROTONIX) 40 MG tablet, Take 1 tablet (40 mg total) 2 (two) times  daily by mouth., Disp: 60 tablet, Rfl: 5 .  phentermine 37.5 MG capsule, TAKE 1 CAPSULE (37.5 MG TOTAL) BY MOUTH EVERY MORNING., Disp: 30 capsule, Rfl: 2 .  polyethylene glycol powder (GLYCOLAX/MIRALAX) powder, Take 17 g by mouth 2 (two) times daily as needed for moderate constipation., Disp: 250 g, Rfl: 1 Social History   Socioeconomic History  . Marital status: Married    Spouse name: Not on file  . Number of children: Not on file  . Years of education: Not on file  . Highest education level: Not on file  Occupational History  . Not on file  Social Needs  . Financial resource strain: Not on file  . Food insecurity:    Worry: Not on file    Inability: Not on file  . Transportation needs:    Medical: Not on file    Non-medical: Not on file  Tobacco Use  . Smoking status: Never Smoker  . Smokeless tobacco: Never Used  Substance and Sexual Activity  . Alcohol use: No  . Drug use: No  . Sexual activity: Not on file  Lifestyle  . Physical activity:    Days per week: Not on file    Minutes per session: Not on file  . Stress: Not on file  Relationships  . Social connections:    Talks on phone: Not on file    Gets together: Not on file    Attends religious service: Not  on file    Active member of club or organization: Not on file    Attends meetings of clubs or organizations: Not on file    Relationship status: Not on file  . Intimate partner violence:    Fear of current or ex partner: Not on file    Emotionally abused: Not on file    Physically abused: Not on file    Forced sexual activity: Not on file  Other Topics Concern  . Not on file  Social History Narrative  . Not on file   Family History  Problem Relation Age of Onset  . Hypertension Mother   . Cancer Father        Prostate  . Hypertension Father     Objective: Office vital signs reviewed. BP 120/71   Pulse 88   Wt 201 lb (91.2 kg)   LMP  (LMP Unknown) Comment: upreg neg 01/27/18  BMI 33.45 kg/m    Physical Examination:  General: Awake, alert, well nourished, No acute distress HEENT: Normal    Eyes: PERRLA, extraocular membranes intact, sclera white    Throat: moist mucus membranes. Cardio: regular rate and rhythm, S1S2 heard, no murmurs appreciated Pulm: clear to auscultation bilaterally, no wheezes, rhonchi or rales; normal work of breathing on room air GI: soft, non-tender, non-distended, bowel sounds present x4, no hepatomegaly, no splenomegaly, no masses GU: No palpable uterine masses. Extremities: warm, well perfused, No edema, cyanosis or clubbing; +2 pulses bilaterally   Assessment/ Plan: 42 y.o. female   1. Morning sickness Likely secondary to doxycycline use.  Patient has an unremarkable abdominal exam.  She is currently afebrile with normal vital signs.  Her urine pregnancy was negative.  We discussed alternative options for contraception.  See below. - Pregnancy, urine  2. Encounter for counseling regarding initiation of other contraceptive measure We reviewed OCPs versus NuvaRing versus Nexplanon versus Depo-Provera today.  Patient is strongly considering Nexplanon is actually scheduled to come in next week for placement.  No contraindications to Nexplanon placement identified today.  I asked that she come in with a full bladder so that we can repeat her urine pregnancy at that visit.  Nexplanon has been reserved for patient.  Follow-up in 1 week.   Orders Placed This Encounter  Procedures  . Pregnancy, urine    Raliegh IpAshly M Ahri Olson, DO [facility] [phone]

## 2018-01-27 NOTE — Patient Instructions (Signed)
We will plan to put in a Nexplanon.  I provided you information about this below.  Other options discussed include Depo-Provera.  Etonogestrel implant What is this medicine? ETONOGESTREL (et oh noe JES trel) is a contraceptive (birth control) device. It is used to prevent pregnancy. It can be used for up to 3 years. This medicine may be used for other purposes; ask your health care provider or pharmacist if you have questions. COMMON BRAND NAME(S): Implanon, Nexplanon What should I tell my health care provider before I take this medicine? They need to know if you have any of these conditions: -abnormal vaginal bleeding -blood vessel disease or blood clots -cancer of the breast, cervix, or liver -depression -diabetes -gallbladder disease -headaches -heart disease or recent heart attack -high blood pressure -high cholesterol -kidney disease -liver disease -renal disease -seizures -tobacco smoker -an unusual or allergic reaction to etonogestrel, other hormones, anesthetics or antiseptics, medicines, foods, dyes, or preservatives -pregnant or trying to get pregnant -breast-feeding How should I use this medicine? This device is inserted just under the skin on the inner side of your upper arm by a health care professional. Talk to your pediatrician regarding the use of this medicine in children. Special care may be needed. Overdosage: If you think you have taken too much of this medicine contact a poison control center or emergency room at once. NOTE: This medicine is only for you. Do not share this medicine with others. What if I miss a dose? This does not apply. What may interact with this medicine? Do not take this medicine with any of the following medications: -amprenavir -bosentan -fosamprenavir This medicine may also interact with the following medications: -barbiturate medicines for inducing sleep or treating seizures -certain medicines for fungal infections like  ketoconazole and itraconazole -grapefruit juice -griseofulvin -medicines to treat seizures like carbamazepine, felbamate, oxcarbazepine, phenytoin, topiramate -modafinil -phenylbutazone -rifampin -rufinamide -some medicines to treat HIV infection like atazanavir, indinavir, lopinavir, nelfinavir, tipranavir, ritonavir -St. John's wort This list may not describe all possible interactions. Give your health care provider a list of all the medicines, herbs, non-prescription drugs, or dietary supplements you use. Also tell them if you smoke, drink alcohol, or use illegal drugs. Some items may interact with your medicine. What should I watch for while using this medicine? This product does not protect you against HIV infection (AIDS) or other sexually transmitted diseases. You should be able to feel the implant by pressing your fingertips over the skin where it was inserted. Contact your doctor if you cannot feel the implant, and use a non-hormonal birth control method (such as condoms) until your doctor confirms that the implant is in place. If you feel that the implant may have broken or become bent while in your arm, contact your healthcare provider. What side effects may I notice from receiving this medicine? Side effects that you should report to your doctor or health care professional as soon as possible: -allergic reactions like skin rash, itching or hives, swelling of the face, lips, or tongue -breast lumps -changes in emotions or moods -depressed mood -heavy or prolonged menstrual bleeding -pain, irritation, swelling, or bruising at the insertion site -scar at site of insertion -signs of infection at the insertion site such as fever, and skin redness, pain or discharge -signs of pregnancy -signs and symptoms of a blood clot such as breathing problems; changes in vision; chest pain; severe, sudden headache; pain, swelling, warmth in the leg; trouble speaking; sudden numbness or weakness of  the  face, arm or leg -signs and symptoms of liver injury like dark yellow or brown urine; general ill feeling or flu-like symptoms; light-colored stools; loss of appetite; nausea; right upper belly pain; unusually weak or tired; yellowing of the eyes or skin -unusual vaginal bleeding, discharge -signs and symptoms of a stroke like changes in vision; confusion; trouble speaking or understanding; severe headaches; sudden numbness or weakness of the face, arm or leg; trouble walking; dizziness; loss of balance or coordination Side effects that usually do not require medical attention (report to your doctor or health care professional if they continue or are bothersome): -acne -back pain -breast pain -changes in weight -dizziness -general ill feeling or flu-like symptoms -headache -irregular menstrual bleeding -nausea -sore throat -vaginal irritation or inflammation This list may not describe all possible side effects. Call your doctor for medical advice about side effects. You may report side effects to FDA at 1-800-FDA-1088. Where should I keep my medicine? This drug is given in a hospital or clinic and will not be stored at home. NOTE: This sheet is a summary. It may not cover all possible information. If you have questions about this medicine, talk to your doctor, pharmacist, or health care provider.  2018 Elsevier/Gold Standard (2016-02-26 11:19:22) Medroxyprogesterone injection [Contraceptive] What is this medicine? MEDROXYPROGESTERONE (me DROX ee proe JES te rone) contraceptive injections prevent pregnancy. They provide effective birth control for 3 months. Depo-subQ Provera 104 is also used for treating pain related to endometriosis. This medicine may be used for other purposes; ask your health care provider or pharmacist if you have questions. COMMON BRAND NAME(S): Depo-Provera, Depo-subQ Provera 104 What should I tell my health care provider before I take this medicine? They need to  know if you have any of these conditions: -frequently drink alcohol -asthma -blood vessel disease or a history of a blood clot in the lungs or legs -bone disease such as osteoporosis -breast cancer -diabetes -eating disorder (anorexia nervosa or bulimia) -high blood pressure -HIV infection or AIDS -kidney disease -liver disease -mental depression -migraine -seizures (convulsions) -stroke -tobacco smoker -vaginal bleeding -an unusual or allergic reaction to medroxyprogesterone, other hormones, medicines, foods, dyes, or preservatives -pregnant or trying to get pregnant -breast-feeding How should I use this medicine? Depo-Provera Contraceptive injection is given into a muscle. Depo-subQ Provera 104 injection is given under the skin. These injections are given by a health care professional. You must not be pregnant before getting an injection. The injection is usually given during the first 5 days after the start of a menstrual period or 6 weeks after delivery of a baby. Talk to your pediatrician regarding the use of this medicine in children. Special care may be needed. These injections have been used in female children who have started having menstrual periods. Overdosage: If you think you have taken too much of this medicine contact a poison control center or emergency room at once. NOTE: This medicine is only for you. Do not share this medicine with others. What if I miss a dose? Try not to miss a dose. You must get an injection once every 3 months to maintain birth control. If you cannot keep an appointment, call and reschedule it. If you wait longer than 13 weeks between Depo-Provera contraceptive injections or longer than 14 weeks between Depo-subQ Provera 104 injections, you could get pregnant. Use another method for birth control if you miss your appointment. You may also need a pregnancy test before receiving another injection. What may interact with this medicine?  Do not take  this medicine with any of the following medications: -bosentan This medicine may also interact with the following medications: -aminoglutethimide -antibiotics or medicines for infections, especially rifampin, rifabutin, rifapentine, and griseofulvin -aprepitant -barbiturate medicines such as phenobarbital or primidone -bexarotene -carbamazepine -medicines for seizures like ethotoin, felbamate, oxcarbazepine, phenytoin, topiramate -modafinil -St. John's wort This list may not describe all possible interactions. Give your health care provider a list of all the medicines, herbs, non-prescription drugs, or dietary supplements you use. Also tell them if you smoke, drink alcohol, or use illegal drugs. Some items may interact with your medicine. What should I watch for while using this medicine? This drug does not protect you against HIV infection (AIDS) or other sexually transmitted diseases. Use of this product may cause you to lose calcium from your bones. Loss of calcium may cause weak bones (osteoporosis). Only use this product for more than 2 years if other forms of birth control are not right for you. The longer you use this product for birth control the more likely you will be at risk for weak bones. Ask your health care professional how you can keep strong bones. You may have a change in bleeding pattern or irregular periods. Many females stop having periods while taking this drug. If you have received your injections on time, your chance of being pregnant is very low. If you think you may be pregnant, see your health care professional as soon as possible. Tell your health care professional if you want to get pregnant within the next year. The effect of this medicine may last a long time after you get your last injection. What side effects may I notice from receiving this medicine? Side effects that you should report to your doctor or health care professional as soon as possible: -allergic  reactions like skin rash, itching or hives, swelling of the face, lips, or tongue -breast tenderness or discharge -breathing problems -changes in vision -depression -feeling faint or lightheaded, falls -fever -pain in the abdomen, chest, groin, or leg -problems with balance, talking, walking -unusually weak or tired -yellowing of the eyes or skin Side effects that usually do not require medical attention (report to your doctor or health care professional if they continue or are bothersome): -acne -fluid retention and swelling -headache -irregular periods, spotting, or absent periods -temporary pain, itching, or skin reaction at site where injected -weight gain This list may not describe all possible side effects. Call your doctor for medical advice about side effects. You may report side effects to FDA at 1-800-FDA-1088. Where should I keep my medicine? This does not apply. The injection will be given to you by a health care professional. NOTE: This sheet is a summary. It may not cover all possible information. If you have questions about this medicine, talk to your doctor, pharmacist, or health care provider.  2018 Elsevier/Gold Standard (2008-08-30 18:37:56)

## 2018-01-30 ENCOUNTER — Telehealth: Payer: Self-pay | Admitting: Family Medicine

## 2018-01-31 ENCOUNTER — Other Ambulatory Visit: Payer: Self-pay | Admitting: Family Medicine

## 2018-01-31 MED ORDER — CLOTRIMAZOLE 10 MG MT TROC: OROMUCOSAL | 2 refills | Status: DC

## 2018-01-31 NOTE — Telephone Encounter (Signed)
lmtcb-Dr Stacks said to stop taking the doxy and to use the Myclex lozenge he sent in.

## 2018-02-03 ENCOUNTER — Ambulatory Visit: Payer: BC Managed Care – PPO | Admitting: Family Medicine

## 2018-02-03 NOTE — Progress Notes (Signed)
Subjective: CC: Contraceptive counseling HPI: Sharon Foster is a 42 y.o. female presenting to clinic today for:  1. Contraception counseling: Patient presents today for nexplanon insertion.  She reports her menstrual cycles have been suppressed by IUD use in the past.  She is currently on menstrual cycle.  Previous methods of birth control tried: IUD. Unfortunately, she vasovagaled during last GYN visit and IUD was not able to be reinserted.  She is currently on OCPs but notes that she is bleeding through the OCP.  She is sexually active with 1 female partner.  She denies h/o STIs.  She reports heavy menstrual bleeding when not on contraception.  She denies personal or family history of: liver disease, breast cancer, clotting disorder (including DVT/PE), migraine headaches. She is a non smoker.  Patient's last menstrual period was 02/06/2018.   Family History  Problem Relation Age of Onset  . Hypertension Mother   . Cancer Father        Prostate  . Hypertension Father    Past Medical History:  Diagnosis Date  . GERD (gastroesophageal reflux disease)    No Known Allergies Medications reviewed.    ROS: Per HPI  Objective: Office vital signs reviewed. BP 124/71   Pulse 88   Temp 98.1 F (36.7 C) (Oral)   Ht 5\' 5"  (1.651 m)   Wt 198 lb (89.8 kg)   LMP  (LMP Unknown) Comment: upreg neg 01/27/18  BMI 32.95 kg/m   Physical Examination:  General: Awake, alert, well nourished, No acute distress Cardio: regular rate and rhythm, +2 radial pulses Pulm: normal work of breathing on room air Extremities: warm, well perfused, No cyanosis or clubbing; +2 pulses bilaterally  No results found for this or any previous visit (from the past 24 hour(s)).  Negative Urine pregnancy  Nexplanon Insertion  The patient denies any allergies to anesthetics or antiseptics.  The risks & benefits of Nexplanon insertion/use were discussed with the patient, who has elected to proceed with placement.  Packaging instructions/ card supplied to patient.  A signed consent was obtained and was placed in patient's medical chart.  Procedure: Patient was placed in supine position. The left arm was flexed at the elbow and externally rotated so that her wrist was parallel to her ear. The medial epicondyle of the left arm was identified. The insertion site was marked 8 cm proximal to the medial epicondyle. The insertion site was cleaned with Betadine. The area surrounding the insertion site was covered with a sterile drape. 2.5 mL of 1% lidocaine without epinephrine was injected just under the skin at the insertion site extending 4 cm proximally. The sterile preloaded disposable Nexaplanon applicator was removed from the sterile packaging and the applicator needle was inserted at a 30 degree angle at 8 cm proximal to the medial epicondyle as marked. The applicator was lowered to a horizontal position and advanced just under the skin for the full length of the needle. The slider of the applicator was retracted fully while the applicator remained in the same position.  The applicator was then removed.  Hemostasis was confirmed.  Position of the Nexplanon device was confirmed by palpation and was demonstrated to the patient. At that time, the patient vasovagal syncoped on table.  Smelling salts were used to rouse the patient.  She was monitored for 30 minutes.  Area was cleaned with rubbing alcohol. Steri strips werer applied to the incision site and site was wrapped in a pressure bandage.    Assessment/  Plan: 42 y.o. female   1. Insertion of Nexplanon Vasovagal episode after demonstration of nexplanon to patient.  Smelling salts as above.  She was monitored in office for about 1/2 hour after event and given fluids to drink.  She was discharged in stable condition to the care of her mother, who drove her home.  AVS was reviewed with patient prior to procedure but reviewed again with mother.  Reasons for return and  emergent evaluation discussed.  Patient voiced good understanding and will follow up as needed. - Pregnancy, urine  2. Encounter for initial prescription of implantable subdermal contraceptive - Pregnancy, urine  The patient was instructed to removed the pressure dressing in 24 hrs.  Home care instructions were reviewed with patient.  Return precautions (including signs and symptoms of infection) were reviewed with patient.  The patient voiced good understanding.  All questions answered.  Raliegh IpAshly M Joellyn Grandt, DO [facility] [phone]

## 2018-02-06 ENCOUNTER — Encounter: Payer: Self-pay | Admitting: Family Medicine

## 2018-02-06 ENCOUNTER — Ambulatory Visit: Payer: BC Managed Care – PPO | Admitting: Family Medicine

## 2018-02-06 VITALS — BP 124/71 | HR 88 | Temp 98.1°F | Ht 65.0 in | Wt 198.0 lb

## 2018-02-06 DIAGNOSIS — Z30017 Encounter for initial prescription of implantable subdermal contraceptive: Secondary | ICD-10-CM | POA: Diagnosis not present

## 2018-02-06 LAB — PREGNANCY, URINE: Preg Test, Ur: NEGATIVE

## 2018-02-06 MED ORDER — ETONOGESTREL 68 MG ~~LOC~~ IMPL
68.0000 mg | DRUG_IMPLANT | Freq: Once | SUBCUTANEOUS | Status: AC
  Administered 2018-02-06: 68 mg via SUBCUTANEOUS

## 2018-02-06 NOTE — Patient Instructions (Signed)
NEXPLANON INSERTION HOME CARE INSTRUCTIONS  You had Nexplanon placed today. This device is over 99% effective in preventing pregnancy and will be good for 3 years.  If you are sexually active, please use a back up method of birth control (condoms, spermicide, etc) for the next 7 days or abstain from intercourse for the next 7 days.   You may notice a small bruise where the device was placed. You may experience mild discomfort for the next 24-48 hours. This is normal. You may have small amounts of bleeding at the insertion site, though bleeding should stop within the next several hours.  You may remove your arm wrap in 24 hours.   You may start showering after 24 hours.  DO NOT take a tub bath, swim, or submerge your body in water for the next 5 days. Leave the Steri-Strips stitches (stickers used to keep your wound closed) on for the next 3-5 days. These should fall off by themselves but if they do not, you may remove them. It is okay to occasionally touch your device to make sure it is still in place but please DO NOT attempt to bend or move it under your skin.   Please contact your doctor immediately if: You develop a fever You have worsening pain or swelling You have redness that goes up and down your arm You have pus draining from your insertion site You are bleeding large amounts   Etonogestrel implant What is this medicine? ETONOGESTREL (et oh noe JES trel) is a contraceptive (birth control) device. It is used to prevent pregnancy. It can be used for up to 3 years. This medicine may be used for other purposes; ask your health care provider or pharmacist if you have questions. COMMON BRAND NAME(S): Implanon, Nexplanon What should I tell my health care provider before I take this medicine? They need to know if you have any of these conditions: -abnormal vaginal bleeding -blood vessel disease or blood clots -cancer of the breast, cervix, or liver -depression -diabetes -gallbladder  disease -headaches -heart disease or recent heart attack -high blood pressure -high cholesterol -kidney disease -liver disease -renal disease -seizures -tobacco smoker -an unusual or allergic reaction to etonogestrel, other hormones, anesthetics or antiseptics, medicines, foods, dyes, or preservatives -pregnant or trying to get pregnant -breast-feeding How should I use this medicine? This device is inserted just under the skin on the inner side of your upper arm by a health care professional. Talk to your pediatrician regarding the use of this medicine in children. Special care may be needed. Overdosage: If you think you have taken too much of this medicine contact a poison control center or emergency room at once. NOTE: This medicine is only for you. Do not share this medicine with others. What if I miss a dose? This does not apply. What may interact with this medicine? Do not take this medicine with any of the following medications: -amprenavir -bosentan -fosamprenavir This medicine may also interact with the following medications: -barbiturate medicines for inducing sleep or treating seizures -certain medicines for fungal infections like ketoconazole and itraconazole -grapefruit juice -griseofulvin -medicines to treat seizures like carbamazepine, felbamate, oxcarbazepine, phenytoin, topiramate -modafinil -phenylbutazone -rifampin -rufinamide -some medicines to treat HIV infection like atazanavir, indinavir, lopinavir, nelfinavir, tipranavir, ritonavir -St. John's wort This list may not describe all possible interactions. Give your health care provider a list of all the medicines, herbs, non-prescription drugs, or dietary supplements you use. Also tell them if you smoke, drink alcohol, or use   illegal drugs. Some items may interact with your medicine. What should I watch for while using this medicine? This product does not protect you against HIV infection (AIDS) or other  sexually transmitted diseases. You should be able to feel the implant by pressing your fingertips over the skin where it was inserted. Contact your doctor if you cannot feel the implant, and use a non-hormonal birth control method (such as condoms) until your doctor confirms that the implant is in place. If you feel that the implant may have broken or become bent while in your arm, contact your healthcare provider. What side effects may I notice from receiving this medicine? Side effects that you should report to your doctor or health care professional as soon as possible: -allergic reactions like skin rash, itching or hives, swelling of the face, lips, or tongue -breast lumps -changes in emotions or moods -depressed mood -heavy or prolonged menstrual bleeding -pain, irritation, swelling, or bruising at the insertion site -scar at site of insertion -signs of infection at the insertion site such as fever, and skin redness, pain or discharge -signs of pregnancy -signs and symptoms of a blood clot such as breathing problems; changes in vision; chest pain; severe, sudden headache; pain, swelling, warmth in the leg; trouble speaking; sudden numbness or weakness of the face, arm or leg -signs and symptoms of liver injury like dark yellow or brown urine; general ill feeling or flu-like symptoms; light-colored stools; loss of appetite; nausea; right upper belly pain; unusually weak or tired; yellowing of the eyes or skin -unusual vaginal bleeding, discharge -signs and symptoms of a stroke like changes in vision; confusion; trouble speaking or understanding; severe headaches; sudden numbness or weakness of the face, arm or leg; trouble walking; dizziness; loss of balance or coordination Side effects that usually do not require medical attention (report to your doctor or health care professional if they continue or are bothersome): -acne -back pain -breast pain -changes in weight -dizziness -general ill  feeling or flu-like symptoms -headache -irregular menstrual bleeding -nausea -sore throat -vaginal irritation or inflammation This list may not describe all possible side effects. Call your doctor for medical advice about side effects. You may report side effects to FDA at 1-800-FDA-1088. Where should I keep my medicine? This drug is given in a hospital or clinic and will not be stored at home. NOTE: This sheet is a summary. It may not cover all possible information. If you have questions about this medicine, talk to your doctor, pharmacist, or health care provider.  2018 Elsevier/Gold Standard (2016-02-26 11:19:22)  

## 2018-02-06 NOTE — Addendum Note (Signed)
Addended byDory Peru: RINTELMANN, GINA C on: 02/06/2018 02:54 PM   Modules accepted: Orders

## 2018-02-13 NOTE — Telephone Encounter (Signed)
Attempted to contact patient again.  Because of length of time since patient's first call, encounter will be filed.

## 2018-02-18 ENCOUNTER — Other Ambulatory Visit: Payer: Self-pay | Admitting: Family Medicine

## 2018-03-17 ENCOUNTER — Ambulatory Visit: Payer: BC Managed Care – PPO | Admitting: Family Medicine

## 2018-03-17 ENCOUNTER — Encounter: Payer: Self-pay | Admitting: Family Medicine

## 2018-03-17 VITALS — BP 113/71 | HR 88 | Temp 97.6°F | Ht 65.0 in | Wt 199.0 lb

## 2018-03-17 DIAGNOSIS — N921 Excessive and frequent menstruation with irregular cycle: Secondary | ICD-10-CM | POA: Diagnosis not present

## 2018-03-17 DIAGNOSIS — E669 Obesity, unspecified: Secondary | ICD-10-CM

## 2018-03-17 MED ORDER — PHENTERMINE HCL 15 MG PO CAPS: 15.0000 mg | ORAL_CAPSULE | ORAL | 2 refills | Status: DC

## 2018-03-17 MED ORDER — TOPIRAMATE 25 MG PO TABS: 25.0000 mg | ORAL_TABLET | Freq: Every day | ORAL | 2 refills | Status: DC

## 2018-03-17 NOTE — Progress Notes (Signed)
Subjective:  Patient ID: Sharon Foster, female    DOB: 1976/07/04  Age: 42 y.o. MRN: [id number]  CC: Medical Management of Chronic Issues and Discuss Phenteremine   HPI Clydell Alberts Sansone presents for patient states that the phentermine made her jittery and interfered with sleep.  She would like to decrease the dose.  She is concerned about weight gain and would like to try at least a small dose to see if that helps her to continue weight reduction.  She is trying to follow a weight loss program low carbohydrate and regular exercise.  Progress has been slow.  Patient notes that she has recently had a Nexplanon implanted.  This seems to be helping with the metrorrhagia.  Depression screen [facility] 2/9 03/25/2018 03/17/2018 02/06/2018  Decreased Interest 0 0 0  Down, Depressed, Hopeless 0 0 0  PHQ - 2 Score 0 0 0    History Lyrick has a past medical history of GERD (gastroesophageal reflux disease).   She has a past surgical history that includes Breast surgery and LASIK.   Her family history includes Cancer in her father; Hypertension in her father and mother.She reports that she has never smoked. She has never used smokeless tobacco. She reports that she does not drink alcohol or use drugs.    ROS Review of Systems  Constitutional: Negative for fever.  HENT: Negative for congestion, rhinorrhea and sore throat.   Respiratory: Negative for cough and shortness of breath.   Cardiovascular: Negative for chest pain and palpitations.  Gastrointestinal: Negative for abdominal pain.  Musculoskeletal: Negative for arthralgias and myalgias.    Objective:  BP 113/71   Pulse 88   Temp 97.6 F (36.4 C) (Oral)   Ht 5\' 5"  (1.651 m)   Wt 199 lb (90.3 kg)   BMI 33.12 kg/m   BP Readings from Last 3 Encounters:  03/25/18 116/67  03/17/18 113/71  02/06/18 124/71    Wt Readings from Last 3 Encounters:  03/25/18 200 lb (90.7 kg)  03/17/18 199 lb (90.3 kg)  02/06/18 198 lb (89.8 kg)      Physical Exam  Constitutional: She is oriented to person, place, and time. She appears well-developed and well-nourished. No distress.  Cardiovascular: Normal rate and regular rhythm.  Pulmonary/Chest: Breath sounds normal.  Neurological: She is alert and oriented to person, place, and time.  Skin: Skin is warm and dry.  Psychiatric: She has a normal mood and affect.      Assessment & Plan:   Laela was seen today for medical management of chronic issues and discuss phenteremine.  Diagnoses and all orders for this visit:  Metrorrhagia  Obesity (BMI 30.0-34.9)  Other orders -     phentermine 15 MG capsule; Take 1 capsule (15 mg total) by mouth every morning. -     topiramate (TOPAMAX) 25 MG tablet; Take 1 tablet (25 mg total) by mouth daily.       I have discontinued Kinley S. Eisinger's phentermine. I am also having her start on phentermine and topiramate. Additionally, I am having her maintain her polyethylene glycol powder and pantoprazole.  Allergies as of 03/17/2018   No Known Allergies     Medication List        Accurate as of 03/17/18 11:59 PM. Always use your most recent med list.          pantoprazole 40 MG tablet Commonly known as:  PROTONIX TAKE 1 TABLET (40 MG TOTAL) 2 (TWO) TIMES DAILY BY MOUTH.  phentermine 15 MG capsule Take 1 capsule (15 mg total) by mouth every morning.   polyethylene glycol powder powder Commonly known as:  GLYCOLAX/MIRALAX Take 17 g by mouth 2 (two) times daily as needed for moderate constipation.   topiramate 25 MG tablet Commonly known as:  TOPAMAX Take 1 tablet (25 mg total) by mouth daily.        Follow-up: Return in about 3 months (around 06/17/2018).  Mechele ClaudeWarren Jeramey Lanuza, M.D.

## 2018-03-25 ENCOUNTER — Ambulatory Visit: Payer: BC Managed Care – PPO | Admitting: Family Medicine

## 2018-03-25 ENCOUNTER — Encounter: Payer: Self-pay | Admitting: Family Medicine

## 2018-03-25 VITALS — BP 116/67 | HR 90 | Temp 97.9°F | Ht 65.0 in | Wt 200.0 lb

## 2018-03-25 DIAGNOSIS — N898 Other specified noninflammatory disorders of vagina: Secondary | ICD-10-CM | POA: Diagnosis not present

## 2018-03-25 DIAGNOSIS — R300 Dysuria: Secondary | ICD-10-CM

## 2018-03-25 MED ORDER — FLUCONAZOLE 150 MG PO TABS: 150.0000 mg | ORAL_TABLET | ORAL | 0 refills | Status: DC

## 2018-03-25 MED ORDER — CEPHALEXIN 500 MG PO CAPS: 500.0000 mg | ORAL_CAPSULE | Freq: Four times a day (QID) | ORAL | 0 refills | Status: DC

## 2018-03-25 MED ORDER — ONDANSETRON 4 MG PO TBDP: 4.0000 mg | ORAL_TABLET | Freq: Three times a day (TID) | ORAL | 0 refills | Status: DC | PRN

## 2018-03-25 NOTE — Addendum Note (Signed)
Addended by: Arville CareETTINGER, Maxcine Strong on: 03/25/2018 02:39 PM   Modules accepted: Orders

## 2018-03-25 NOTE — Progress Notes (Signed)
BP 116/67 (BP Location: Left Arm)   Pulse 90   Temp 97.9 F (36.6 C) (Oral)   Ht 5\' 5"  (1.651 m)   Wt 200 lb (90.7 kg)   BMI 33.28 kg/m    Subjective:    Patient ID: Sharon Foster, female    DOB: May 31, 1976, 42 y.o.   MRN: [id number]  HPI: Sharon Foster is a 42 y.o. female presenting on 03/25/2018 for Urinary Tract Infection (burning ) and Vaginitis   HPI Vaginal discharge and irritation and burning Patient comes in today complaining of vaginal discharge and irritation and burning sensation that has been going on over the past 1 to 2 days.  She says she gets yeast infections frequently has been having a lot of troubles with these since she is been going through some hormonal changes and try to few different kinds of birth control and now she has a Nexplanon which has been doing better for but she started developing the symptoms again yesterday.  She has a lot of vaginal itching and tenderness and burning.  She also has some burning with urination at the introitus denies any abdominal pain or flank pain or fevers or chills or urinary frequency.  Relevant past medical, surgical, family and social history reviewed and updated as indicated. Interim medical history since our last visit reviewed. Allergies and medications reviewed and updated.  Review of Systems  Constitutional: Negative for chills and fever.  Eyes: Negative for visual disturbance.  Respiratory: Negative for chest tightness and shortness of breath.   Cardiovascular: Negative for chest pain and leg swelling.  Gastrointestinal: Negative for abdominal pain.  Genitourinary: Positive for dysuria, vaginal discharge and vaginal pain. Negative for difficulty urinating, frequency, hematuria, urgency and vaginal bleeding.  Musculoskeletal: Negative for back pain and gait problem.  Skin: Negative for rash.  Neurological: Negative for light-headedness and headaches.  Psychiatric/Behavioral: Negative for agitation and  behavioral problems.  All other systems reviewed and are negative.   Per HPI unless specifically indicated above   Allergies as of 03/25/2018   No Known Allergies     Medication List        Accurate as of 03/25/18 10:29 AM. Always use your most recent med list.          pantoprazole 40 MG tablet Commonly known as:  PROTONIX TAKE 1 TABLET (40 MG TOTAL) 2 (TWO) TIMES DAILY BY MOUTH.   phentermine 15 MG capsule Take 1 capsule (15 mg total) by mouth every morning.   polyethylene glycol powder powder Commonly known as:  GLYCOLAX/MIRALAX Take 17 g by mouth 2 (two) times daily as needed for moderate constipation.   topiramate 25 MG tablet Commonly known as:  TOPAMAX Take 1 tablet (25 mg total) by mouth daily.          Objective:    BP 116/67 (BP Location: Left Arm)   Pulse 90   Temp 97.9 F (36.6 C) (Oral)   Ht 5\' 5"  (1.651 m)   Wt 200 lb (90.7 kg)   BMI 33.28 kg/m   Wt Readings from Last 3 Encounters:  03/25/18 200 lb (90.7 kg)  03/17/18 199 lb (90.3 kg)  02/06/18 198 lb (89.8 kg)    Physical Exam  Constitutional: She is oriented to person, place, and time. She appears well-developed and well-nourished. No distress.  Eyes: Conjunctivae are normal.  Cardiovascular: Normal rate, regular rhythm, normal heart sounds and intact distal pulses.  No murmur heard. Pulmonary/Chest: Effort normal and breath sounds  normal. No respiratory distress. She has no wheezes.  Abdominal: Soft. Bowel sounds are normal. She exhibits no distension and no mass. There is no tenderness. There is no guarding.  Neurological: She is alert and oriented to person, place, and time. Coordination normal.  Skin: Skin is warm and dry. No rash noted. She is not diaphoretic.  Psychiatric: She has a normal mood and affect. Her behavior is normal.  Nursing note and vitals reviewed.   Urinalysis, dip only: 3+ leukocytes, otherwise negative    Assessment & Plan:   Problem List Items Addressed This  Visit    None    Visit Diagnoses    Vagina itching    -  Primary   Relevant Medications   fluconazole (DIFLUCAN) 150 MG tablet   Other Relevant Orders   WET PREP FOR TRICH, YEAST, CLUE   Dysuria       Relevant Medications   cephALEXin (KEFLEX) 500 MG capsule   Other Relevant Orders   Urine Culture   Urinalysis      Wet prep was sent as a send out. Follow up plan: Return if symptoms worsen or fail to improve.  Counseling provided for all of the vaccine components Orders Placed This Encounter  Procedures  . Urine Culture  . WET PREP FOR TRICH, YEAST, CLUE  . Urinalysis    Sharon CareJoshua Sharon Blaze, MD [facility] 03/25/2018, 10:29 AM

## 2018-03-26 LAB — SPECIMEN STATUS REPORT

## 2018-03-26 LAB — URINE CULTURE

## 2018-03-27 LAB — URINALYSIS
Bilirubin, UA: NEGATIVE
Glucose, UA: NEGATIVE
Ketones, UA: NEGATIVE
Nitrite, UA: NEGATIVE
PH UA: 6.5 (ref 5.0–7.5)
Protein, UA: NEGATIVE
RBC UA: NEGATIVE
SPEC GRAV UA: 1.02 (ref 1.005–1.030)
Urobilinogen, Ur: 1 mg/dL (ref 0.2–1.0)

## 2018-03-27 LAB — WET PREP FOR TRICH, YEAST, CLUE
Clue Cell Exam: NEGATIVE
Trichomonas Exam: NEGATIVE
YEAST EXAM: NEGATIVE

## 2018-03-29 ENCOUNTER — Encounter: Payer: Self-pay | Admitting: Family Medicine

## 2018-05-03 ENCOUNTER — Encounter: Payer: Self-pay | Admitting: Physician Assistant

## 2018-05-03 ENCOUNTER — Ambulatory Visit: Payer: BC Managed Care – PPO | Admitting: Physician Assistant

## 2018-05-03 VITALS — BP 122/69 | HR 90 | Temp 98.7°F | Ht 65.0 in | Wt 200.6 lb

## 2018-05-03 DIAGNOSIS — N761 Subacute and chronic vaginitis: Secondary | ICD-10-CM

## 2018-05-03 DIAGNOSIS — A499 Bacterial infection, unspecified: Secondary | ICD-10-CM

## 2018-05-03 MED ORDER — METRONIDAZOLE 500 MG PO TABS: 500.0000 mg | ORAL_TABLET | Freq: Two times a day (BID) | ORAL | 0 refills | Status: DC

## 2018-05-04 NOTE — Progress Notes (Signed)
BP 122/69   Pulse 90   Temp 98.7 F (37.1 C) (Oral)   Ht 5\' 5"  (1.651 m)   Wt 200 lb 9.6 oz (91 kg)   BMI 33.38 kg/m    Subjective:    Patient ID: Sharon Foster, female    DOB: April 23, 1976, 42 y.o.   MRN: [id number]  HPI: Sharon Foster is a 42 y.o. female presenting on 05/03/2018 for vaginal irritation (vaginal burning, some rectal burning) This patient comes in with 1 week of having vaginal discharge.  She states there is some itching.  Her discharge is mainly clear.  She states there is no fishy odor.  She has not changed products.  She has not been overly hot or not to be very sweaty.  Exam does show some bacteria on her microscopic exam.  But there is no trichomonas or Gardnerella.  There are also no yeast.   Past Medical History:  Diagnosis Date  . GERD (gastroesophageal reflux disease)    Relevant past medical, surgical, family and social history reviewed and updated as indicated. Interim medical history since our last visit reviewed. Allergies and medications reviewed and updated. DATA REVIEWED: CHART IN EPIC  Family History reviewed for pertinent findings.  Review of Systems  Constitutional: Negative.   HENT: Negative.   Eyes: Negative.   Respiratory: Negative.   Gastrointestinal: Negative.   Genitourinary: Positive for dysuria, vaginal discharge and vaginal pain. Negative for hematuria and pelvic pain.    Allergies as of 05/03/2018   No Known Allergies     Medication List        Accurate as of 05/03/18 11:59 PM. Always use your most recent med list.          cephALEXin 500 MG capsule Commonly known as:  KEFLEX Take 1 capsule (500 mg total) by mouth 4 (four) times daily.   fluconazole 150 MG tablet Commonly known as:  DIFLUCAN Take 1 tablet (150 mg total) by mouth once a week.   metroNIDAZOLE 500 MG tablet Commonly known as:  FLAGYL Take 1 tablet (500 mg total) by mouth 2 (two) times daily.   ondansetron 4 MG disintegrating tablet Commonly  known as:  ZOFRAN-ODT Take 1 tablet (4 mg total) by mouth every 8 (eight) hours as needed for nausea or vomiting.   pantoprazole 40 MG tablet Commonly known as:  PROTONIX TAKE 1 TABLET (40 MG TOTAL) 2 (TWO) TIMES DAILY BY MOUTH.   phentermine 15 MG capsule Take 1 capsule (15 mg total) by mouth every morning.   polyethylene glycol powder powder Commonly known as:  GLYCOLAX/MIRALAX Take 17 g by mouth 2 (two) times daily as needed for moderate constipation.   topiramate 25 MG tablet Commonly known as:  TOPAMAX Take 1 tablet (25 mg total) by mouth daily.          Objective:    BP 122/69   Pulse 90   Temp 98.7 F (37.1 C) (Oral)   Ht 5\' 5"  (1.651 m)   Wt 200 lb 9.6 oz (91 kg)   BMI 33.38 kg/m   No Known Allergies  Wt Readings from Last 3 Encounters:  05/03/18 200 lb 9.6 oz (91 kg)  03/25/18 200 lb (90.7 kg)  03/17/18 199 lb (90.3 kg)    Physical Exam  Constitutional: She is oriented to person, place, and time. She appears well-developed and well-nourished.  HENT:  Head: Normocephalic and atraumatic.  Eyes: Pupils are equal, round, and reactive to light. Conjunctivae and  EOM are normal.  Cardiovascular: Normal rate, regular rhythm, normal heart sounds and intact distal pulses.  Pulmonary/Chest: Effort normal and breath sounds normal.  Abdominal: Soft. Bowel sounds are normal.  Neurological: She is alert and oriented to person, place, and time. She has normal reflexes.  Skin: Skin is warm and dry. No rash noted.  Psychiatric: She has a normal mood and affect. Her behavior is normal. Judgment and thought content normal.        Assessment & Plan:   1. Bacterial infection - metroNIDAZOLE (FLAGYL) 500 MG tablet; Take 1 tablet (500 mg total) by mouth 2 (two) times daily.  Dispense: 14 tablet; Refill: 0 - Ova and parasite examination; Future - Stool culture; Future  2. Chronic vaginitis Recheck as needed   Continue all other maintenance medications as listed  above.  Follow up plan: No follow-ups on file.  Educational handout given for survey  Remus LofflerAngel S. Laryn Venning PA-C [facility] [address]  [place], [place] [id number] [phone]   05/04/2018, 1:24 PM

## 2018-06-21 ENCOUNTER — Ambulatory Visit: Payer: BC Managed Care – PPO | Admitting: Family Medicine

## 2018-06-21 ENCOUNTER — Encounter: Payer: Self-pay | Admitting: Family Medicine

## 2018-06-21 VITALS — BP 115/68 | HR 83 | Temp 98.2°F | Ht 65.0 in | Wt 208.0 lb

## 2018-06-21 DIAGNOSIS — E669 Obesity, unspecified: Secondary | ICD-10-CM | POA: Diagnosis not present

## 2018-06-21 DIAGNOSIS — K219 Gastro-esophageal reflux disease without esophagitis: Secondary | ICD-10-CM

## 2018-06-21 MED ORDER — PANTOPRAZOLE SODIUM 40 MG PO TBEC: 40.0000 mg | DELAYED_RELEASE_TABLET | Freq: Two times a day (BID) | ORAL | 0 refills | Status: DC

## 2018-06-21 MED ORDER — PHENTERMINE HCL 37.5 MG PO CAPS: 37.5000 mg | ORAL_CAPSULE | ORAL | 2 refills | Status: DC

## 2018-06-21 NOTE — Progress Notes (Signed)
Subjective:  Patient ID: Alwyn Ren, female    DOB: October 08, 1975  Age: 42 y.o. MRN: [id number]  CC: No chief complaint on file.   HPI Mava Suares Hubble presents for patient in for follow-up of GERD. Currently asymptomatic taking  PPI daily. There is no chest pain or heartburn. No hematemesis and no melena. No dysphagia or choking. Onset is remote. Progression is stable. Complicating factors, none.  Increased weight since decreasing the dose of phentermine. Getting nausea with topiramate so cut it back to twice a week. States eating a banana for breakfast, carb rich foods as examples at lunch.    Depression screen [facility] 2/9 06/21/2018 03/25/2018 03/17/2018  Decreased Interest 0 0 0  Down, Depressed, Hopeless 0 0 0  PHQ - 2 Score 0 0 0    History Dionne has a past medical history of GERD (gastroesophageal reflux disease).   She has a past surgical history that includes Breast surgery and LASIK.   Her family history includes Cancer in her father; Hypertension in her father and mother.She reports that she has never smoked. She has never used smokeless tobacco. She reports that she does not drink alcohol or use drugs.    ROS Review of Systems  Constitutional: Negative.   HENT: Negative.   Eyes: Negative for visual disturbance.  Respiratory: Negative for shortness of breath.   Cardiovascular: Negative for chest pain.  Gastrointestinal: Positive for nausea. Negative for abdominal pain.  Musculoskeletal: Negative for arthralgias.    Objective:  BP 115/68   Pulse 83   Temp 98.2 F (36.8 C) (Oral)   Ht '5\' 5"'  (1.651 m)   Wt 208 lb (94.3 kg)   BMI 34.61 kg/m   BP Readings from Last 3 Encounters:  06/21/18 115/68  05/03/18 122/69  03/25/18 116/67    Wt Readings from Last 3 Encounters:  06/21/18 208 lb (94.3 kg)  05/03/18 200 lb 9.6 oz (91 kg)  03/25/18 200 lb (90.7 kg)     Physical Exam  Constitutional: She is oriented to person, place, and time. She appears  well-developed and well-nourished. No distress.  Cardiovascular: Normal rate and regular rhythm.  Pulmonary/Chest: Breath sounds normal.  Neurological: She is alert and oriented to person, place, and time.  Skin: Skin is warm and dry.  Psychiatric: She has a normal mood and affect.      Assessment & Plan:   Diagnoses and all orders for this visit:  Gastroesophageal reflux disease without esophagitis  Obesity (BMI 30.0-34.9) -     CMP14+EGFR -     Amb ref to Medical Nutrition Therapy-MNT  Other orders -     phentermine 37.5 MG capsule; Take 1 capsule (37.5 mg total) by mouth every morning. -     pantoprazole (PROTONIX) 40 MG tablet; Take 1 tablet (40 mg total) by mouth 2 (two) times daily.       I have discontinued Aneliese S. Parlee's fluconazole, cephALEXin, ondansetron, and metroNIDAZOLE. I have also changed her pantoprazole. Additionally, I am having her start on phentermine. Lastly, I am having her maintain her polyethylene glycol powder, phentermine, and topiramate.  Allergies as of 06/21/2018   No Known Allergies     Medication List        Accurate as of 06/21/18 12:42 PM. Always use your most recent med list.          pantoprazole 40 MG tablet Commonly known as:  PROTONIX Take 1 tablet (40 mg total) by mouth 2 (two) times daily.  phentermine 15 MG capsule Take 1 capsule (15 mg total) by mouth every morning.   phentermine 37.5 MG capsule Take 1 capsule (37.5 mg total) by mouth every morning.   polyethylene glycol powder powder Commonly known as:  GLYCOLAX/MIRALAX Take 17 g by mouth 2 (two) times daily as needed for moderate constipation.   topiramate 25 MG tablet Commonly known as:  TOPAMAX Take 1 tablet (25 mg total) by mouth daily.        Follow-up: Return in about 6 months (around 12/21/2018).  Claretta Fraise, M.D.

## 2018-06-22 LAB — CMP14+EGFR
A/G RATIO: 1.6 (ref 1.2–2.2)
ALBUMIN: 3.9 g/dL (ref 3.5–5.5)
ALK PHOS: 78 IU/L (ref 39–117)
ALT: 17 IU/L (ref 0–32)
AST: 22 IU/L (ref 0–40)
BILIRUBIN TOTAL: 0.5 mg/dL (ref 0.0–1.2)
BUN/Creatinine Ratio: 16 (ref 9–23)
BUN: 15 mg/dL (ref 6–24)
CO2: 25 mmol/L (ref 20–29)
CREATININE: 0.95 mg/dL (ref 0.57–1.00)
Calcium: 8.7 mg/dL (ref 8.7–10.2)
Chloride: 103 mmol/L (ref 96–106)
GFR calc Af Amer: 86 mL/min/{1.73_m2} (ref >59)
GFR calc non Af Amer: 75 mL/min/{1.73_m2} (ref >59)
GLOBULIN, TOTAL: 2.5 g/dL (ref 1.5–4.5)
Glucose: 112 mg/dL — ABNORMAL HIGH (ref 65–99)
Potassium: 4.2 mmol/L (ref 3.5–5.2)
Sodium: 141 mmol/L (ref 134–144)
Total Protein: 6.4 g/dL (ref 6.0–8.5)

## 2018-06-22 NOTE — Progress Notes (Signed)
Hello Sharon Foster,  Your lab result is normal.Some minor variations that are not significant are commonly marked abnormal, but do not represent any medical problem for you.  Best regards, Mechele Claude, M.D.

## 2018-07-05 ENCOUNTER — Ambulatory Visit: Payer: BC Managed Care – PPO | Admitting: Family Medicine

## 2018-07-05 ENCOUNTER — Encounter: Payer: Self-pay | Admitting: Family Medicine

## 2018-07-05 VITALS — BP 105/72 | HR 86 | Temp 99.1°F | Ht 65.0 in | Wt 205.0 lb

## 2018-07-05 DIAGNOSIS — J019 Acute sinusitis, unspecified: Secondary | ICD-10-CM

## 2018-07-05 DIAGNOSIS — B9689 Other specified bacterial agents as the cause of diseases classified elsewhere: Secondary | ICD-10-CM | POA: Diagnosis not present

## 2018-07-05 MED ORDER — AMOXICILLIN-POT CLAVULANATE 875-125 MG PO TABS: 1.0000 | ORAL_TABLET | Freq: Two times a day (BID) | ORAL | 0 refills | Status: DC

## 2018-07-05 MED ORDER — FLUCONAZOLE 150 MG PO TABS: 150.0000 mg | ORAL_TABLET | Freq: Once | ORAL | 0 refills | Status: AC

## 2018-07-05 NOTE — Patient Instructions (Signed)

## 2018-07-05 NOTE — Progress Notes (Signed)
Subjective: CC: URI PCP: Mechele ClaudeStacks, Warren, MD ZOX:[id number]:Sharon Foster is a 42 y.o. female presenting to clinic today for:  1. URI Patient reports onset of sinus pressure, bilateral ringing in her ears, sinus headache about 2 weeks ago.  She does note some dizziness with bending over.  She had a nosebleed today.  She feels like the nasal discharge is clear.  She does report an associated mild cough.  Symptoms seem to be getting worse.  No shortness of breath or wheeze.  No hemoptysis.   ROS: Per HPI  No Known Allergies Past Medical History:  Diagnosis Date  . GERD (gastroesophageal reflux disease)     Current Outpatient Medications:  .  pantoprazole (PROTONIX) 40 MG tablet, Take 1 tablet (40 mg total) by mouth 2 (two) times daily., Disp: 180 tablet, Rfl: 0 .  phentermine 15 MG capsule, Take 1 capsule (15 mg total) by mouth every morning., Disp: 30 capsule, Rfl: 2 .  phentermine 37.5 MG capsule, Take 1 capsule (37.5 mg total) by mouth every morning., Disp: 30 capsule, Rfl: 2 .  polyethylene glycol powder (GLYCOLAX/MIRALAX) powder, Take 17 g by mouth 2 (two) times daily as needed for moderate constipation., Disp: 250 g, Rfl: 1 .  topiramate (TOPAMAX) 25 MG tablet, Take 1 tablet (25 mg total) by mouth daily., Disp: 30 tablet, Rfl: 2 Social History   Socioeconomic History  . Marital status: Married    Spouse name: Not on file  . Number of children: Not on file  . Years of education: Not on file  . Highest education level: Not on file  Occupational History  . Not on file  Social Needs  . Financial resource strain: Not on file  . Food insecurity:    Worry: Not on file    Inability: Not on file  . Transportation needs:    Medical: Not on file    Non-medical: Not on file  Tobacco Use  . Smoking status: Never Smoker  . Smokeless tobacco: Never Used  Substance and Sexual Activity  . Alcohol use: No  . Drug use: No  . Sexual activity: Not on file  Lifestyle  . Physical activity:      Days per week: Not on file    Minutes per session: Not on file  . Stress: Not on file  Relationships  . Social connections:    Talks on phone: Not on file    Gets together: Not on file    Attends religious service: Not on file    Active member of club or organization: Not on file    Attends meetings of clubs or organizations: Not on file    Relationship status: Not on file  . Intimate partner violence:    Fear of current or ex partner: Not on file    Emotionally abused: Not on file    Physically abused: Not on file    Forced sexual activity: Not on file  Other Topics Concern  . Not on file  Social History Narrative  . Not on file   Family History  Problem Relation Age of Onset  . Hypertension Mother   . Cancer Father        Prostate  . Hypertension Father     Objective: Office vital signs reviewed. BP 105/72   Pulse 86   Temp 99.1 F (37.3 C)   Ht 5\' 5"  (1.651 m)   Wt 205 lb (93 kg)   BMI 34.11 kg/m   Physical Examination:  General: Awake, alert, well nourished, nontoxic appearing. No acute distress HEENT: Normal    Neck: No masses palpated. No lymphadenopathy    Ears: Tympanic membranes intact, normal light reflex, no erythema, no bulging    Eyes: PERRLA, extraocular membranes intact, sclera white    Nose: nasal turbinates moist, opaque/yellow nasal discharge    Throat: moist mucus membranes, no erythema, no tonsillar exudate.  Airway is patent Cardio: regular rate and rhythm, S1S2 heard, no murmurs appreciated Pulm: clear to auscultation bilaterally, no wheezes, rhonchi or rales; normal work of breathing on room air   Assessment/ Plan: 42 y.o. female   1. Acute bacterial sinusitis Patient here with low-grade fever to 99.33F.  Symptoms are concerning for bacterial sinusitis.  I have prescribed her Augmentin p.o. twice daily for the next 10 days.  Diflucan also sent empirically, as patient does develop yeast infections with oral antibiotics.  Home care  instructions reviewed.  Reasons for return discussed.  She will follow-up PRN.   No orders of the defined types were placed in this encounter.  Meds ordered this encounter  Medications  . amoxicillin-clavulanate (AUGMENTIN) 875-125 MG tablet    Sig: Take 1 tablet by mouth 2 (two) times daily.    Dispense:  20 tablet    Refill:  0  . fluconazole (DIFLUCAN) 150 MG tablet    Sig: Take 1 tablet (150 mg total) by mouth once for 1 dose. May repeat in 3 days if needed for persistent symptoms    Dispense:  2 tablet    Refill:  0     Sharon Paquette Hulen Skains, DO [facility] [phone]

## 2018-07-10 ENCOUNTER — Ambulatory Visit: Payer: BC Managed Care – PPO | Admitting: Family Medicine

## 2018-07-10 ENCOUNTER — Encounter: Payer: Self-pay | Admitting: Family Medicine

## 2018-07-10 ENCOUNTER — Telehealth: Payer: Self-pay | Admitting: Family Medicine

## 2018-07-10 VITALS — BP 121/72 | HR 80 | Temp 97.8°F | Ht 65.0 in | Wt 202.0 lb

## 2018-07-10 DIAGNOSIS — N898 Other specified noninflammatory disorders of vagina: Secondary | ICD-10-CM

## 2018-07-10 DIAGNOSIS — N939 Abnormal uterine and vaginal bleeding, unspecified: Secondary | ICD-10-CM | POA: Diagnosis not present

## 2018-07-10 LAB — HEMOGLOBIN, FINGERSTICK: Hemoglobin: 15.3 g/dL (ref 11.1–15.9)

## 2018-07-10 LAB — WET PREP FOR TRICH, YEAST, CLUE
Clue Cell Exam: NEGATIVE
Trichomonas Exam: NEGATIVE
YEAST EXAM: NEGATIVE

## 2018-07-10 MED ORDER — MEDROXYPROGESTERONE ACETATE 10 MG PO TABS: 20.0000 mg | ORAL_TABLET | Freq: Three times a day (TID) | ORAL | 0 refills | Status: DC

## 2018-07-10 NOTE — Progress Notes (Signed)
Subjective: CC: abnormal vaginal bleeding PCP: Mechele ClaudeStacks, Warren, MD NWG:[id number]:Sharon Foster is a 42 y.o. female presenting to clinic today for:  1. Abnormal vaginal bleeding Patient reports a 17-day history of heavy menstrual bleeding with dark red blood.  She has a Nexplanon in place.  She notes associated vaginal irritation.  She took an old Diflucan to see if this might help.  Denies any foul odors to the discharge.  She has concern for possible infidelity and would like to be checked for STIs as well.  Reports intermittent episodes of lightheadedness but nothing that is sustained.  Additionally, she wants to see if she can establish with a new OB/GYN that has ultrasound in office.  [facility] is [facility]   ROS: Per HPI  No Known Allergies Past Medical History:  Diagnosis Date  . GERD (gastroesophageal reflux disease)     Current Outpatient Medications:  .  amoxicillin-clavulanate (AUGMENTIN) 875-125 MG tablet, Take 1 tablet by mouth 2 (two) times daily., Disp: 20 tablet, Rfl: 0 .  pantoprazole (PROTONIX) 40 MG tablet, Take 1 tablet (40 mg total) by mouth 2 (two) times daily., Disp: 180 tablet, Rfl: 0 .  phentermine 37.5 MG capsule, Take 1 capsule (37.5 mg total) by mouth every morning., Disp: 30 capsule, Rfl: 2 .  polyethylene glycol powder (GLYCOLAX/MIRALAX) powder, Take 17 g by mouth 2 (two) times daily as needed for moderate constipation., Disp: 250 g, Rfl: 1 Social History   Socioeconomic History  . Marital status: Married    Spouse name: Not on file  . Number of children: Not on file  . Years of education: Not on file  . Highest education level: Not on file  Occupational History  . Not on file  Social Needs  . Financial resource strain: Not on file  . Food insecurity:    Worry: Not on file    Inability: Not on file  . Transportation needs:    Medical: Not on file    Non-medical: Not on file  Tobacco Use  . Smoking status: Never Smoker  .  Smokeless tobacco: Never Used  Substance and Sexual Activity  . Alcohol use: No  . Drug use: No  . Sexual activity: Not on file  Lifestyle  . Physical activity:    Days per week: Not on file    Minutes per session: Not on file  . Stress: Not on file  Relationships  . Social connections:    Talks on phone: Not on file    Gets together: Not on file    Attends religious service: Not on file    Active member of club or organization: Not on file    Attends meetings of clubs or organizations: Not on file    Relationship status: Not on file  . Intimate partner violence:    Fear of current or ex partner: Not on file    Emotionally abused: Not on file    Physically abused: Not on file    Forced sexual activity: Not on file  Other Topics Concern  . Not on file  Social History Narrative  . Not on file   Family History  Problem Relation Age of Onset  . Hypertension Mother   . Cancer Father        Prostate  . Hypertension Father     Objective: Office vital signs reviewed. BP 121/72   Pulse 80   Temp 97.8 F (36.6 C) (Oral)   Ht 5\' 5"  (  1.651 m)   Wt 202 lb (91.6 kg)   BMI 33.61 kg/m   Physical Examination:  General: Awake, alert, well nourished, No acute distress HEENT: Normal. No conjunctival pallor. Cardio: regular rate Pulm: normal work of breathing on room air GU: external vaginal tissue normal, cervix retroverted, no punctate lesions on cervix appreciated, moderate amounts of dark red blood within the vaginal vault.  No cervical motion tenderness, no abdominal/ adnexal masses  Assessment/ Plan: 42 y.o. female   1. Abnormal uterine bleeding I have placed her on Provera 20 mg 3 times daily for the next 7 days.  Additionally, I have highly recommended that she see GYN for consideration of ablation versus hysterectomy.  Referral has been placed to OB/GYN in [place] per her request.  Check hemoglobin given prolonged vaginal bleeding.  Check wet prep and GC chlamydia  as well. - Hemoglobin, fingerstick - Ambulatory referral to Obstetrics / Gynecology - WET PREP FOR TRICH, YEAST, CLUE - GC/Chlamydia Probe Amp([facility])  2. Vaginal irritation - WET PREP FOR TRICH, YEAST, CLUE - GC/Chlamydia Probe Amp([facility])   Orders Placed This Encounter  Procedures  . WET PREP FOR TRICH, YEAST, CLUE  . GC/Chlamydia Probe Amp([facility])  . Hemoglobin, fingerstick  . Ambulatory referral to Obstetrics / Gynecology    Referral Priority:   Urgent    Referral Type:   Consultation    Referral Reason:   Specialty Services Required    Requested Specialty:   Obstetrics and Gynecology    Number of Visits Requested:   1   Meds ordered this encounter  Medications  . medroxyPROGESTERone (PROVERA) 10 MG tablet    Sig: Take 2 tablets (20 mg total) by mouth 3 (three) times daily for 7 days.    Dispense:  42 tablet    Refill:  0     Ashly Hulen Skains, DO [facility] [phone]

## 2018-07-10 NOTE — Patient Instructions (Signed)

## 2018-07-10 NOTE — Telephone Encounter (Signed)
Pt aware.

## 2018-07-12 IMAGING — DX DG CHEST 2V
3 series · 3 of 3 positions shown · non-contrast
Comparison: None.

CLINICAL DATA: Cough and congestion.

EXAM:
CHEST  2 VIEW

[chest pa (1 of 2)]
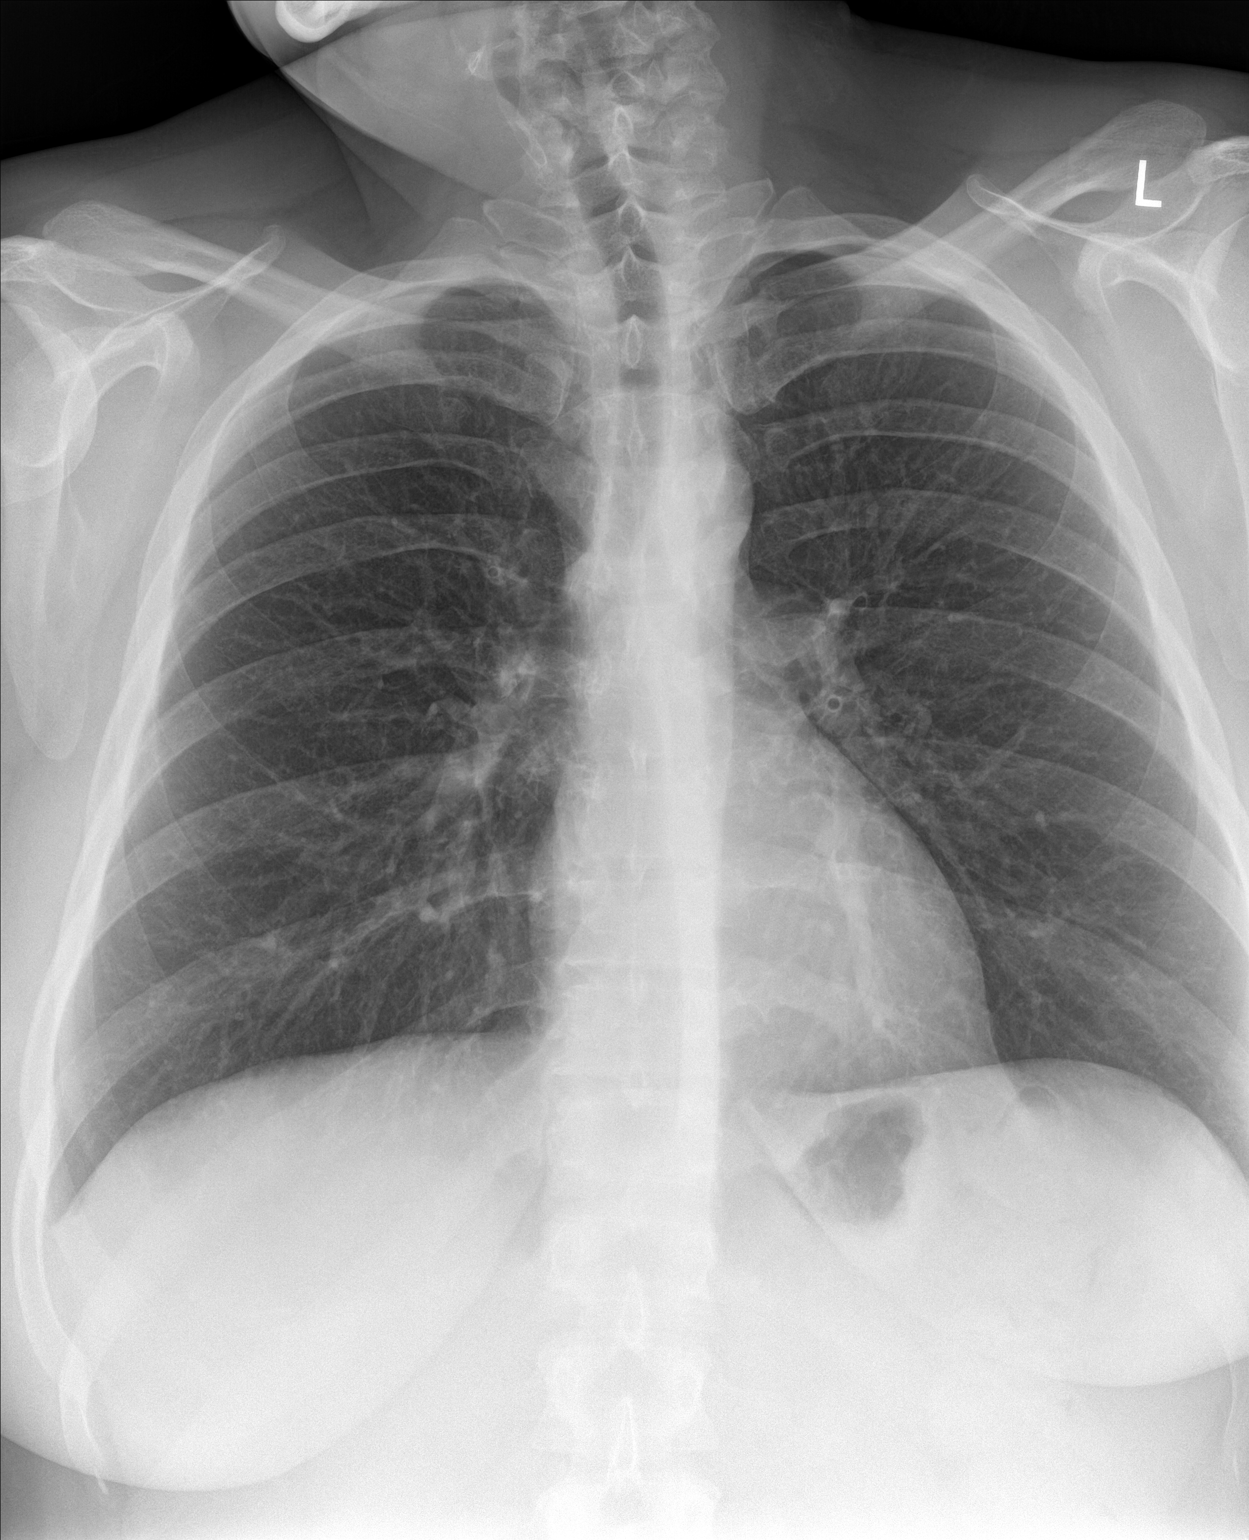

[chest lat]
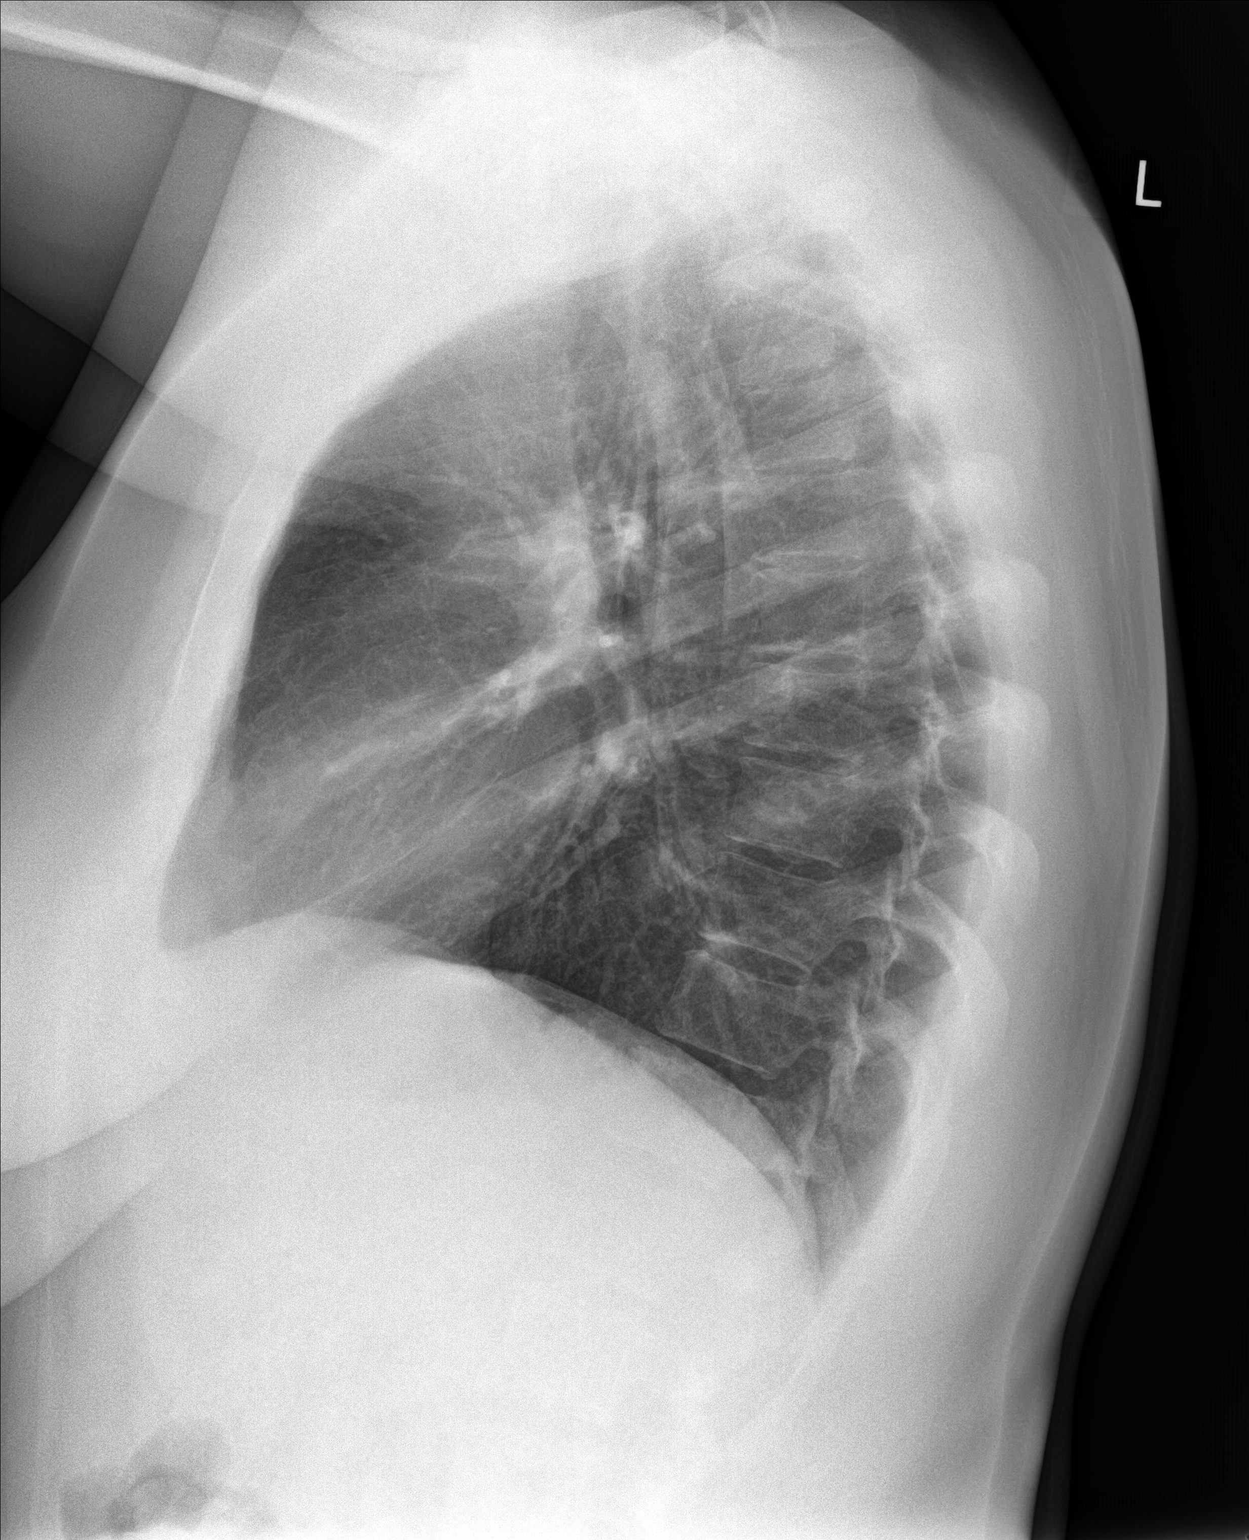

[chest pa (2 of 2)]
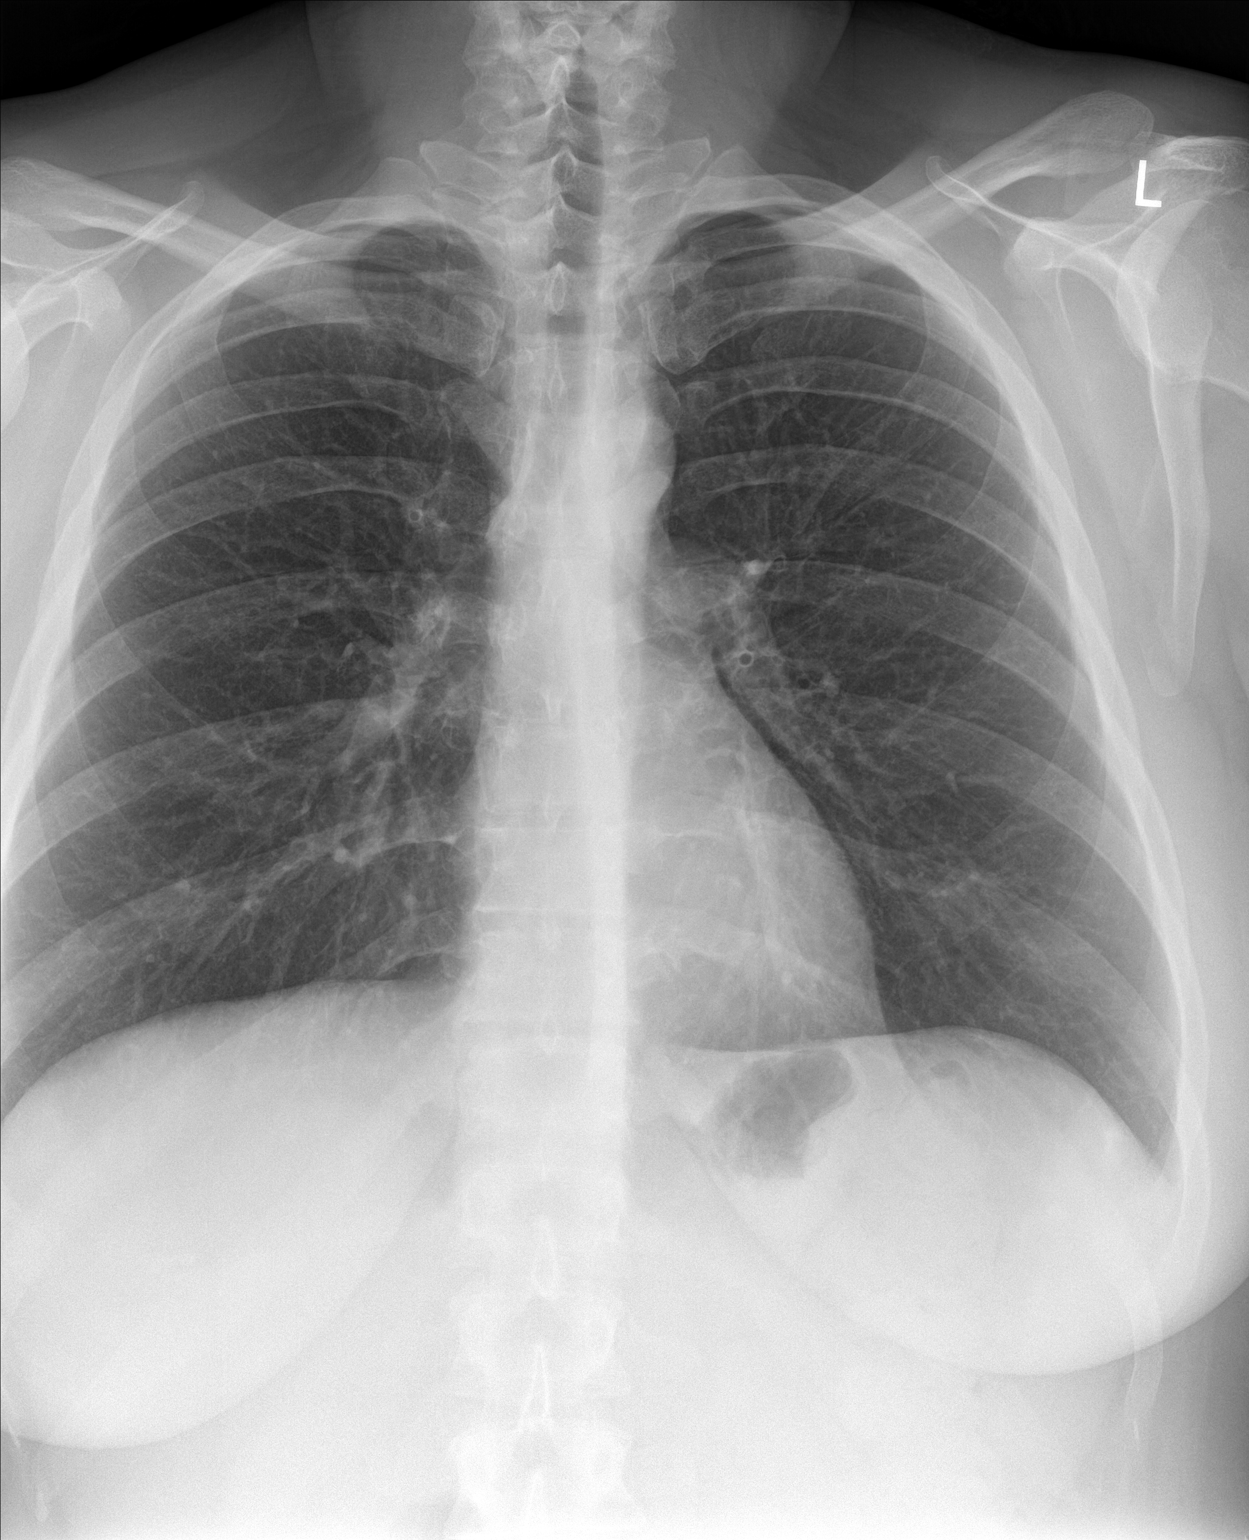

[3 of 3 positions shown; findings below may reference images not displayed]

FINDINGS: The heart size and mediastinal contours are within normal limits.
There is no evidence of pulmonary edema, consolidation,
pneumothorax, nodule or pleural fluid. The visualized skeletal
structures are unremarkable.
IMPRESSION: No active cardiopulmonary disease.

## 2018-07-13 LAB — GC/CHLAMYDIA PROBE AMP
Chlamydia trachomatis, NAA: NEGATIVE
Neisseria gonorrhoeae by PCR: NEGATIVE

## 2018-07-19 ENCOUNTER — Telehealth: Payer: Self-pay | Admitting: Family Medicine

## 2018-07-20 ENCOUNTER — Other Ambulatory Visit: Payer: Self-pay | Admitting: Family Medicine

## 2018-07-21 MED ORDER — MEDROXYPROGESTERONE ACETATE 10 MG PO TABS: 20.0000 mg | ORAL_TABLET | Freq: Three times a day (TID) | ORAL | 0 refills | Status: DC | PRN

## 2018-07-21 NOTE — Telephone Encounter (Signed)
Patient notified refill of Provera sent, and to make sure to keep appointment with GYN.

## 2018-07-21 NOTE — Telephone Encounter (Signed)
I have refilled her Provera. Keep her follow up with GYN.

## 2018-09-16 ENCOUNTER — Ambulatory Visit: Payer: BC Managed Care – PPO | Admitting: Nurse Practitioner

## 2018-09-16 ENCOUNTER — Encounter: Payer: Self-pay | Admitting: Nurse Practitioner

## 2018-09-16 VITALS — BP 112/69 | HR 85 | Temp 97.8°F | Ht 65.0 in | Wt 209.0 lb

## 2018-09-16 DIAGNOSIS — J0110 Acute frontal sinusitis, unspecified: Secondary | ICD-10-CM

## 2018-09-16 MED ORDER — AMOXICILLIN-POT CLAVULANATE 875-125 MG PO TABS: 1.0000 | ORAL_TABLET | Freq: Two times a day (BID) | ORAL | 0 refills | Status: DC

## 2018-09-16 NOTE — Progress Notes (Signed)
Subjective:    Patient ID: Sharon Foster, female    DOB: Feb 03, 1976, 43 y.o.   MRN: [id number]   Chief Complaint: sinus pressure   HPI Patient come sin today c/o sinus pressure.Started Wednesday. Denies fever. Has had some dizziness.    Review of Systems  Constitutional: Negative for chills and fever.  HENT: Positive for congestion, nosebleeds, rhinorrhea, sinus pressure and sinus pain.   Respiratory: Negative for cough and shortness of breath.   Cardiovascular: Negative.   Gastrointestinal: Negative.   Neurological: Positive for dizziness and headaches.  Psychiatric/Behavioral: Negative.        Objective:   Physical Exam Vitals signs and nursing note reviewed.  Constitutional:      General: She is not in acute distress.    Appearance: She is normal weight.  HENT:     Right Ear: Hearing, tympanic membrane, ear canal and external ear normal.     Left Ear: Hearing, tympanic membrane, ear canal and external ear normal.     Nose: Mucosal edema, congestion and rhinorrhea present.     Right Turbinates: Swollen.     Left Turbinates: Swollen.     Right Sinus: Frontal sinus tenderness present. No maxillary sinus tenderness.     Left Sinus: Frontal sinus tenderness present. No maxillary sinus tenderness.     Mouth/Throat:     Lips: Pink.     Mouth: Mucous membranes are moist.     Pharynx: Oropharynx is clear.  Neck:     Musculoskeletal: Normal range of motion and neck supple.  Cardiovascular:     Rate and Rhythm: Normal rate and regular rhythm.     Heart sounds: Normal heart sounds.  Pulmonary:     Effort: Pulmonary effort is normal.     Breath sounds: Normal breath sounds.  Abdominal:     General: Abdomen is flat. Bowel sounds are normal.  Lymphadenopathy:     Cervical: No cervical adenopathy.  Skin:    General: Skin is warm and dry.  Neurological:     General: No focal deficit present.     Mental Status: She is alert and oriented to person, place, and time.    Psychiatric:        Mood and Affect: Mood normal.        Behavior: Behavior normal.    BP 112/69   Pulse 85   Temp 97.8 F (36.6 C) (Oral)   Ht 5\' 5"  (1.651 m)   Wt 209 lb (94.8 kg)   BMI 34.78 kg/m         Assessment & Plan:  Sharon Foster in today with chief complaint of sinus pressure   1. Acute non-recurrent frontal sinusitis 1. Take meds as prescribed 2. Use a cool mist humidifier especially during the winter months and when heat has been humid. 3. Use saline nose sprays frequently 4. Saline irrigations of the nose can be very helpful if done frequently.  * 4X daily for 1 week*  * Use of a nettie pot can be helpful with this. Follow directions with this* 5. Drink plenty of fluids 6. Keep thermostat turn down low 7.For any cough or congestion  Use plain Mucinex- regular strength or max strength is fine   * Children- consult with Pharmacist for dosing 8. For fever or aces or pains- take tylenol or ibuprofen appropriate for age and weight.  * for fevers greater than 101 orally you may alternate ibuprofen and tylenol every  3 hours.    -  amoxicillin-clavulanate (AUGMENTIN) 875-125 MG tablet; Take 1 tablet by mouth 2 (two) times daily.  Dispense: 14 tablet; Refill: 0  Mary-Margaret Daphine DeutscherMartin, FNP

## 2018-09-16 NOTE — Patient Instructions (Signed)

## 2018-10-02 ENCOUNTER — Telehealth: Payer: Self-pay | Admitting: Nurse Practitioner

## 2018-10-02 MED ORDER — FLUCONAZOLE 150 MG PO TABS: 150.0000 mg | ORAL_TABLET | Freq: Once | ORAL | 0 refills | Status: AC

## 2018-10-02 NOTE — Telephone Encounter (Signed)
Diflucan rx sent to pharmacy.

## 2018-10-02 NOTE — Telephone Encounter (Signed)
What symptoms do you have? A lot of itching and burning. Needs diflucan #2 How long have you been sick? Last Thursday  Have you been seen for this problem Yes  If your provider decides to give you a prescription, which pharmacy would you like for it to be sent to? [facility] in [place]   Patient informed that this information will be sent to the clinical staff for review and that they should receive a follow up call.

## 2018-10-02 NOTE — Telephone Encounter (Signed)
Aware rx has been sent to pharmacy

## 2018-10-09 ENCOUNTER — Ambulatory Visit: Payer: BC Managed Care – PPO | Admitting: Physician Assistant

## 2018-10-09 ENCOUNTER — Encounter: Payer: Self-pay | Admitting: Physician Assistant

## 2018-10-09 VITALS — BP 119/69 | HR 70 | Temp 98.2°F | Ht 65.0 in | Wt 213.0 lb

## 2018-10-09 DIAGNOSIS — I83811 Varicose veins of right lower extremities with pain: Secondary | ICD-10-CM | POA: Diagnosis not present

## 2018-10-09 DIAGNOSIS — N898 Other specified noninflammatory disorders of vagina: Secondary | ICD-10-CM

## 2018-10-09 DIAGNOSIS — I7300 Raynaud's syndrome without gangrene: Secondary | ICD-10-CM | POA: Diagnosis not present

## 2018-10-09 MED ORDER — FLUCONAZOLE 150 MG PO TABS: ORAL_TABLET | ORAL | 0 refills | Status: DC

## 2018-10-09 NOTE — Patient Instructions (Signed)
Raynaud Phenomenon    Raynaud phenomenon is a condition that affects the blood vessels (arteries) that carry blood to your fingers and toes. The arteries that supply blood to your ears, lips, nipples, or the tip of your nose might also be affected. Raynaud phenomenon causes the arteries to become narrow temporarily (spasm). As a result, the flow of blood to the affected areas is temporarily decreased. This usually occurs in response to cold temperatures or stress. During an attack, the skin in the affected areas turns white, then blue, and finally red. You may also feel tingling or numbness in those areas.  Attacks usually last for only a brief period, and then the blood flow to the area returns to normal. In most cases, Raynaud phenomenon does not cause serious health problems.  What are the causes?  In many cases, the cause of this condition is not known. The condition may occur on its own (primary Raynaud phenomenon) or may be associated with other diseases or factors (secondary Raynaud phenomenon).  Possible causes may include:  · Diseases or medical conditions that damage the arteries.  · Injuries and repetitive actions that hurt the hands or feet.  · Being exposed to certain chemicals.  · Taking medicines that narrow the arteries.  · Other medical conditions, such as lupus, scleroderma, rheumatoid arthritis, thyroid problems, blood disorders, Sjogren syndrome, or atherosclerosis.  What increases the risk?  The following factors may make you more likely to develop this condition:  · Being 20-40 years old.  · Being female.  · Having a family history of Raynaud phenomenon.  · Living in a cold climate.  · Smoking.  What are the signs or symptoms?  Symptoms of this condition usually occur when you are exposed to cold temperatures or when you have emotional stress. The symptoms may last for a few minutes or up to several hours. They usually affect your fingers but may also affect your toes, nipples, lips, ears, or  the tip of your nose. Symptoms may include:  · Changes in skin color. The skin in the affected areas will turn pale or white. The skin may then change from white to bluish to red as normal blood flow returns to the area.  · Numbness, tingling, or pain in the affected areas.  In severe cases, symptoms may include:  · Skin sores.  · Tissues decaying and dying (gangrene).  How is this diagnosed?  This condition may be diagnosed based on:  · Your symptoms and medical history.  · A physical exam. During the exam, you may be asked to put your hands in cold water to check for a reaction to cold temperature.  · Tests, such as:  ? Blood tests to check for other diseases or conditions.  ? A test to check the movement of blood through your arteries and veins (vascular ultrasound).  ? A test in which the skin at the base of your fingernail is examined under a microscope (nailfold capillaroscopy).  How is this treated?  Treatment for this condition often involves making lifestyle changes and taking steps to control your exposure to cold temperatures. For more severe cases, medicine (calcium channel blockers) may be used to improve blood flow. Surgery is sometimes done to block the nerves that control the affected arteries, but this is rare.  Follow these instructions at home:  Avoiding cold temperatures  Take these steps to avoid exposure to cold:  · If possible, stay indoors during cold weather.  · When you   go outside during cold weather, dress in layers and wear mittens, a hat, a scarf, and warm footwear.  · Wear mittens or gloves when handling ice or frozen food.  · Use holders for glasses or cans containing cold drinks.  · Let warm water run for a while before taking a shower or bath.  · Warm up the car before driving in cold weather.  Lifestyle    · If possible, avoid stressful and emotional situations. Try to find ways to manage your stress, such as:  ? Exercise.  ? Yoga.  ? Meditation.  ? Biofeedback.  · Do not use any  products that contain nicotine or tobacco, such as cigarettes and e-cigarettes. If you need help quitting, ask your health care provider.  · Avoid secondhand smoke.  · Limit your use of caffeine.  ? Switch to decaffeinated coffee, tea, and soda.  ? Avoid chocolate.  · Avoid vibrating tools and machinery.  General instructions  · Protect your hands and feet from injuries, cuts, or bruises.  · Avoid wearing tight rings or wristbands.  · Wear loose fitting socks and comfortable, roomy shoes.  · Take over-the-counter and prescription medicines only as told by your health care provider.  Contact a health care provider if:  · Your discomfort becomes worse despite lifestyle changes.  · You develop sores on your fingers or toes that do not heal.  · Your fingers or toes turn black.  · You have breaks in the skin on your fingers or toes.  · You have a fever.  · You have pain or swelling in your joints.  · You have a rash.  · Your symptoms occur on only one side of your body.  Summary  · Raynaud phenomenon is a condition that affects the arteries that carry blood to your fingers, toes, ears, lips, nipples, or the tip of your nose.  · In many cases, the cause of this condition is not known.  · Symptoms of this condition include changes in skin color, and numbness and tingling of the affected area.  · Treatment for this condition includes lifestyle changes, reducing exposure to cold temperatures, and using medicines for severe cases of the condition.  · Contact your health care provider if your condition worsens despite treatment.  This information is not intended to replace advice given to you by your health care provider. Make sure you discuss any questions you have with your health care provider.  Document Released: 08/06/2000 Document Revised: 02/22/2017 Document Reviewed: 09/20/2016  Elsevier Interactive Patient Education © 2019 [employer]

## 2018-10-10 NOTE — Progress Notes (Signed)
BP 119/69   Pulse 70   Temp 98.2 F (36.8 C) (Oral)   Ht 5\' 5"  (1.651 m)   Wt 213 lb (96.6 kg)   BMI 35.45 kg/m    Subjective:    Patient ID: Sharon Foster, female    DOB: [birth year], 43 y.o.   MRN: [id number]  HPI: Sharon Foster is a 43 y.o. female presenting on 10/09/2018 for coldness in fingertips and feet She has had years of fingertips going cold and white. She does notice it when she gets cold and is doing work outside. Does not consistently use gloves. She does not have any long term diseases.    She also has recurrent vaginal infection   Past Medical History:  Diagnosis Date  . GERD (gastroesophageal reflux disease)    Relevant past medical, surgical, family and social history reviewed and updated as indicated. Interim medical history since our last visit reviewed. Allergies and medications reviewed and updated. DATA REVIEWED: CHART IN EPIC  Family History reviewed for pertinent findings.  Review of Systems  Constitutional: Negative.   HENT: Negative.   Eyes: Negative.   Respiratory: Negative.   Gastrointestinal: Negative.   Genitourinary: Negative.   Musculoskeletal: Negative for joint swelling.  Skin: Positive for color change and pallor.    Allergies as of 10/09/2018   No Known Allergies     Medication List       Accurate as of October 09, 2018 11:59 PM. Always use your most recent med list.        fluconazole 150 MG tablet Commonly known as:  DIFLUCAN 1 po q week x 4 weeks   pantoprazole 40 MG tablet Commonly known as:  PROTONIX Take 1 tablet (40 mg total) by mouth 2 (two) times daily.   phentermine 37.5 MG capsule Take 1 capsule (37.5 mg total) by mouth every morning.   polyethylene glycol powder powder Commonly known as:  GLYCOLAX/MIRALAX Take 17 g by mouth 2 (two) times daily as needed for moderate constipation.          Objective:    BP 119/69   Pulse 70   Temp 98.2 F (36.8 C) (Oral)   Ht 5\' 5"  (1.651 m)   Wt 213  lb (96.6 kg)   BMI 35.45 kg/m   No Known Allergies  Wt Readings from Last 3 Encounters:  10/09/18 213 lb (96.6 kg)  09/16/18 209 lb (94.8 kg)  07/10/18 202 lb (91.6 kg)    Physical Exam Constitutional:      Appearance: She is well-developed.  HENT:     Head: Normocephalic and atraumatic.  Eyes:     Conjunctiva/sclera: Conjunctivae normal.     Pupils: Pupils are equal, round, and reactive to light.  Cardiovascular:     Rate and Rhythm: Normal rate and regular rhythm.     Heart sounds: Normal heart sounds.  Pulmonary:     Effort: Pulmonary effort is normal.     Breath sounds: Normal breath sounds.  Abdominal:     General: Bowel sounds are normal.     Palpations: Abdomen is soft.  Skin:    General: Skin is warm and dry.     Findings: No rash.     Comments: Hans with redness and blanching at the fingertips  Neurological:     Mental Status: She is alert and oriented to person, place, and time.     Deep Tendon Reflexes: Reflexes are normal and symmetric.  Psychiatric:  Behavior: Behavior normal.        Thought Content: Thought content normal.        Judgment: Judgment normal.         Assessment & Plan:   1. Raynaud's disease without gangrene Information given  2. Varicose veins of right lower extremity with pain Information given  3. Vaginal irritation - fluconazole (DIFLUCAN) 150 MG tablet; 1 po q week x 4 weeks  Dispense: 4 tablet; Refill: 0   Continue all other maintenance medications as listed above.  Follow up plan: No follow-ups on file.  Educational handout given for Raynaud's  Remus Loffler PA-C [facility] [address]  [place], [place] [address] [phone]   10/10/2018, 5:26 PM

## 2018-10-15 ENCOUNTER — Other Ambulatory Visit: Payer: Self-pay | Admitting: Family Medicine

## 2018-10-16 ENCOUNTER — Other Ambulatory Visit: Payer: Self-pay | Admitting: Family Medicine

## 2018-10-16 ENCOUNTER — Telehealth: Payer: Self-pay | Admitting: Family Medicine

## 2018-10-16 MED ORDER — PHENTERMINE HCL 37.5 MG PO TABS: 37.5000 mg | ORAL_TABLET | Freq: Every day | ORAL | 2 refills | Status: DC

## 2018-10-16 NOTE — Telephone Encounter (Signed)
Last seen 10/09/18

## 2018-10-16 NOTE — Telephone Encounter (Signed)
I sent in the requested prescription 

## 2018-10-17 MED ORDER — PANTOPRAZOLE SODIUM 40 MG PO TBEC: 40.0000 mg | DELAYED_RELEASE_TABLET | Freq: Two times a day (BID) | ORAL | 0 refills | Status: DC

## 2018-10-17 NOTE — Telephone Encounter (Signed)
Pt aware and also requested refills on her Protonix. Pt is scheduled for routine follow up with Dr Darlyn Read 12/27/18 so refills sent in to get her to appt.

## 2018-12-12 ENCOUNTER — Other Ambulatory Visit: Payer: Self-pay

## 2018-12-12 ENCOUNTER — Ambulatory Visit (INDEPENDENT_AMBULATORY_CARE_PROVIDER_SITE_OTHER): Payer: BC Managed Care – PPO | Admitting: Family Medicine

## 2018-12-12 ENCOUNTER — Encounter: Payer: Self-pay | Admitting: Family Medicine

## 2018-12-12 DIAGNOSIS — B354 Tinea corporis: Secondary | ICD-10-CM

## 2018-12-12 DIAGNOSIS — N761 Subacute and chronic vaginitis: Secondary | ICD-10-CM | POA: Diagnosis not present

## 2018-12-12 DIAGNOSIS — J029 Acute pharyngitis, unspecified: Secondary | ICD-10-CM | POA: Diagnosis not present

## 2018-12-12 MED ORDER — AMOXICILLIN-POT CLAVULANATE 875-125 MG PO TABS: 1.0000 | ORAL_TABLET | Freq: Two times a day (BID) | ORAL | 0 refills | Status: DC

## 2018-12-12 MED ORDER — FLUCONAZOLE 100 MG PO TABS: ORAL_TABLET | ORAL | 0 refills | Status: DC

## 2018-12-12 MED ORDER — BUTENAFINE HCL 1 % EX CREA: TOPICAL_CREAM | CUTANEOUS | 5 refills | Status: DC

## 2018-12-12 NOTE — Progress Notes (Signed)
No chief complaint on file.   HPI  Patient presents today for sore throat for three days. Cottage cheese looking plaque on tonsils. Using hot salty water for some relief. Antibiotics cause yeast.   Red scaly lesions on legs for almost a month. A little better with antifungal cream.   PMH: Smoking status noted ROS: Per HPI  Objective: There were no vitals taken for this visit. Gen: NAD, alert, cooperative with exam HEENT: NCAT, EOMI, PERRL CV: RRR, good S1/S2, no murmur Resp: CTABL, no wheezes, non-labored Abd: SNTND, BS present, no guarding or organomegaly Ext: No edema, warm Neuro: Alert and oriented, No gross deficits  Assessment and plan:  1. Ringworm, body   2. Pharyngitis, unspecified etiology   3. Chronic vaginitis     Meds ordered this encounter  Medications  . fluconazole (DIFLUCAN) 100 MG tablet    Sig: Take two with first dose. Then starting the next day take one daily until all are taken.    Dispense:  15 tablet    Refill:  0  . amoxicillin-clavulanate (AUGMENTIN) 875-125 MG tablet    Sig: Take 1 tablet by mouth 2 (two) times daily. Take all of this medication    Dispense:  20 tablet    Refill:  0  . Butenafine HCl 1 % cream    Sig: Apply twice daily to affected areas    Dispense:  30 g    Refill:  5   1. Ringworm, body   2. Pharyngitis, unspecified etiology   3. Chronic vaginitis     Meds ordered this encounter  Medications  . fluconazole (DIFLUCAN) 100 MG tablet    Sig: Take two with first dose. Then starting the next day take one daily until all are taken.    Dispense:  15 tablet    Refill:  0  . amoxicillin-clavulanate (AUGMENTIN) 875-125 MG tablet    Sig: Take 1 tablet by mouth 2 (two) times daily. Take all of this medication    Dispense:  20 tablet    Refill:  0  . Butenafine HCl 1 % cream    Sig: Apply twice daily to affected areas    Dispense:  30 g    Refill:  5    Follow up as needed.  Mechele Claude, MD   Virtual Visit via  telephone Note  I discussed the limitations, risks, security and privacy concerns of performing an evaluation and management service by telephone and the availability of in person appointments. I also discussed with the patient that there may be a patient responsible charge related to this service. The patient expressed understanding and agreed to proceed. Pt. Is at home. Dr. Darlyn Read is in his home office.  Follow Up Instructions:   I discussed the assessment and treatment plan with the patient. The patient was provided an opportunity to ask questions and all were answered. The patient agreed with the plan and demonstrated an understanding of the instructions.   The patient was advised to call back or seek an in-person evaluation if the symptoms worsen or if the condition fails to improve as anticipated.  Visit started: 8: 10 Call ended:  8:25 Total minutes including chart review and phone contact time: 17   Follow up as needed.  Mechele Claude, MD

## 2018-12-27 ENCOUNTER — Ambulatory Visit: Payer: BC Managed Care – PPO | Admitting: Family Medicine

## 2019-01-16 ENCOUNTER — Telehealth: Payer: Self-pay | Admitting: Family Medicine

## 2019-01-16 ENCOUNTER — Other Ambulatory Visit: Payer: Self-pay | Admitting: Family Medicine

## 2019-01-16 DIAGNOSIS — N898 Other specified noninflammatory disorders of vagina: Secondary | ICD-10-CM

## 2019-01-16 MED ORDER — FLUCONAZOLE 150 MG PO TABS: ORAL_TABLET | ORAL | 0 refills | Status: DC

## 2019-01-16 NOTE — Telephone Encounter (Signed)
I sent in the requested prescription 

## 2019-01-16 NOTE — Telephone Encounter (Signed)
Please review and advise.

## 2019-01-16 NOTE — Telephone Encounter (Signed)
What symptoms do you have? Yeast infection  How long have you been sick? Since Friday   Have you been seen for this problem? Yes was prescribed antibiotics and developed yeast inf  If your provider decides to give you a prescription, which pharmacy would you like for it to be sent to?[facility]   Patient informed that this information will be sent to the clinical staff for review and that they should receive a follow up call.

## 2019-01-17 NOTE — Telephone Encounter (Signed)
Left message , script was sent in. 

## 2019-02-15 ENCOUNTER — Other Ambulatory Visit: Payer: Self-pay

## 2019-02-16 ENCOUNTER — Encounter: Payer: Self-pay | Admitting: Physician Assistant

## 2019-02-16 ENCOUNTER — Ambulatory Visit: Payer: BC Managed Care – PPO | Admitting: Physician Assistant

## 2019-02-16 VITALS — BP 118/77 | HR 84 | Temp 98.4°F | Ht 65.0 in | Wt 211.0 lb

## 2019-02-16 DIAGNOSIS — N938 Other specified abnormal uterine and vaginal bleeding: Secondary | ICD-10-CM

## 2019-02-17 LAB — CMP14+EGFR
ALT: 14 IU/L (ref 0–32)
AST: 18 IU/L (ref 0–40)
Albumin/Globulin Ratio: 1.8 (ref 1.2–2.2)
Albumin: 4.2 g/dL (ref 3.8–4.8)
Alkaline Phosphatase: 73 IU/L (ref 39–117)
BUN/Creatinine Ratio: 15 (ref 9–23)
BUN: 13 mg/dL (ref 6–24)
Bilirubin Total: 0.8 mg/dL (ref 0.0–1.2)
CO2: 24 mmol/L (ref 20–29)
Calcium: 9 mg/dL (ref 8.7–10.2)
Chloride: 106 mmol/L (ref 96–106)
Creatinine, Ser: 0.87 mg/dL (ref 0.57–1.00)
GFR calc Af Amer: 95 mL/min/{1.73_m2} (ref >59)
GFR calc non Af Amer: 82 mL/min/{1.73_m2} (ref >59)
Globulin, Total: 2.4 g/dL (ref 1.5–4.5)
Glucose: 88 mg/dL (ref 65–99)
Potassium: 3.8 mmol/L (ref 3.5–5.2)
Sodium: 142 mmol/L (ref 134–144)
Total Protein: 6.6 g/dL (ref 6.0–8.5)

## 2019-02-17 LAB — CBC WITH DIFFERENTIAL/PLATELET
Basophils Absolute: 0.1 10*3/uL (ref 0.0–0.2)
Basos: 1 %
EOS (ABSOLUTE): 0.1 10*3/uL (ref 0.0–0.4)
Eos: 2 %
Hematocrit: 39.5 % (ref 34.0–46.6)
Hemoglobin: 13.2 g/dL (ref 11.1–15.9)
Immature Grans (Abs): 0 10*3/uL (ref 0.0–0.1)
Immature Granulocytes: 0 %
Lymphocytes Absolute: 2.5 10*3/uL (ref 0.7–3.1)
Lymphs: 39 %
MCH: 32.4 pg (ref 26.6–33.0)
MCHC: 33.4 g/dL (ref 31.5–35.7)
MCV: 97 fL (ref 79–97)
Monocytes Absolute: 0.5 10*3/uL (ref 0.1–0.9)
Monocytes: 7 %
Neutrophils Absolute: 3.2 10*3/uL (ref 1.4–7.0)
Neutrophils: 51 %
Platelets: 190 10*3/uL (ref 150–450)
RBC: 4.08 x10E6/uL (ref 3.77–5.28)
RDW: 12.8 % (ref 11.7–15.4)
WBC: 6.3 10*3/uL (ref 3.4–10.8)

## 2019-02-17 LAB — IRON: Iron: 104 ug/dL (ref 27–159)

## 2019-02-19 ENCOUNTER — Encounter: Payer: Self-pay | Admitting: Physician Assistant

## 2019-02-19 NOTE — Progress Notes (Signed)
BP 118/77   Pulse 84   Temp 98.4 F (36.9 C) (Oral)   Ht '5\' 5"'  (1.651 m)   Wt 211 lb (95.7 kg)   LMP 01/29/2019   BMI 35.11 kg/m    Subjective:    Patient ID: Sharon Foster, female    DOB: 02-05-1976, 43 y.o.   MRN: [id number]  HPI: Sharon Foster is a 43 y.o. female presenting on 02/16/2019 for Menstrual Problem (she has been bleeding since 06/08)  This patient comes in having difficulty with her menstrual cycle.  She has had some very irregular menses.  She had a lot of bleeding in November and December she took Estrace 1 mg and it did seem to calm things down.  She has had an IUD placed but she states that she is not feeling any better and if anything has even more side effects.  She does have an appointment with her gynecologist soon have encouraged her to get with him to discuss the failure of all the medications that she has had and what some of the next possibilities will be.   Past Medical History:  Diagnosis Date  . GERD (gastroesophageal reflux disease)    Relevant past medical, surgical, family and social history reviewed and updated as indicated. Interim medical history since our last visit reviewed. Allergies and medications reviewed and updated. DATA REVIEWED: CHART IN EPIC  Family History reviewed for pertinent findings.  Review of Systems  Constitutional: Positive for fatigue. Negative for activity change and fever.  HENT: Negative.   Eyes: Negative.   Respiratory: Negative.  Negative for cough.   Cardiovascular: Negative.  Negative for chest pain.  Gastrointestinal: Negative.  Negative for abdominal pain.  Endocrine: Negative.   Genitourinary: Positive for menstrual problem and vaginal bleeding. Negative for dysuria.  Musculoskeletal: Negative.   Skin: Negative.   Neurological: Negative.     Allergies as of 02/16/2019   No Known Allergies     Medication List       Accurate as of February 16, 2019 11:59 PM. If you have any questions, ask your  nurse or doctor.        STOP taking these medications   amoxicillin-clavulanate 875-125 MG tablet Commonly known as: AUGMENTIN Stopped by: Terald Sleeper, PA-C   fluconazole 150 MG tablet Commonly known as: Diflucan Stopped by: Terald Sleeper, PA-C     TAKE these medications   Butenafine HCl 1 % cream Apply twice daily to affected areas   estradiol 1 MG tablet Commonly known as: ESTRACE Take 1 mg by mouth daily.   pantoprazole 40 MG tablet Commonly known as: PROTONIX Take 1 tablet (40 mg total) by mouth 2 (two) times daily.   phentermine 37.5 MG tablet Commonly known as: ADIPEX-P Take 1 tablet (37.5 mg total) by mouth daily before breakfast.   polyethylene glycol powder 17 GM/SCOOP powder Commonly known as: GLYCOLAX/MIRALAX Take 17 g by mouth 2 (two) times daily as needed for moderate constipation.          Objective:    BP 118/77   Pulse 84   Temp 98.4 F (36.9 C) (Oral)   Ht '5\' 5"'  (1.651 m)   Wt 211 lb (95.7 kg)   LMP 01/29/2019   BMI 35.11 kg/m   No Known Allergies  Wt Readings from Last 3 Encounters:  02/16/19 211 lb (95.7 kg)  10/09/18 213 lb (96.6 kg)  09/16/18 209 lb (94.8 kg)    Physical Exam Constitutional:  Appearance: She is well-developed.  HENT:     Head: Normocephalic and atraumatic.  Eyes:     Conjunctiva/sclera: Conjunctivae normal.     Pupils: Pupils are equal, round, and reactive to light.  Cardiovascular:     Rate and Rhythm: Normal rate and regular rhythm.     Heart sounds: Normal heart sounds.  Pulmonary:     Effort: Pulmonary effort is normal.     Breath sounds: Normal breath sounds.  Abdominal:     General: Bowel sounds are normal.     Palpations: Abdomen is soft.  Skin:    General: Skin is warm and dry.     Findings: No rash.  Neurological:     Mental Status: She is alert and oriented to person, place, and time.     Deep Tendon Reflexes: Reflexes are normal and symmetric.  Psychiatric:        Behavior: Behavior  normal.        Thought Content: Thought content normal.        Judgment: Judgment normal.     Results for orders placed or performed in visit on 02/16/19  CBC with Differential/Platelet  Result Value Ref Range   WBC 6.3 3.4 - 10.8 x10E3/uL   RBC 4.08 3.77 - 5.28 x10E6/uL   Hemoglobin 13.2 11.1 - 15.9 g/dL   Hematocrit 39.5 34.0 - 46.6 %   MCV 97 79 - 97 fL   MCH 32.4 26.6 - 33.0 pg   MCHC 33.4 31.5 - 35.7 g/dL   RDW 12.8 11.7 - 15.4 %   Platelets 190 150 - 450 x10E3/uL   Neutrophils 51 Not Estab. %   Lymphs 39 Not Estab. %   Monocytes 7 Not Estab. %   Eos 2 Not Estab. %   Basos 1 Not Estab. %   Neutrophils Absolute 3.2 1.4 - 7.0 x10E3/uL   Lymphocytes Absolute 2.5 0.7 - 3.1 x10E3/uL   Monocytes Absolute 0.5 0.1 - 0.9 x10E3/uL   EOS (ABSOLUTE) 0.1 0.0 - 0.4 x10E3/uL   Basophils Absolute 0.1 0.0 - 0.2 x10E3/uL   Immature Granulocytes 0 Not Estab. %   Immature Grans (Abs) 0.0 0.0 - 0.1 x10E3/uL  Iron  Result Value Ref Range   Iron 104 27 - 159 ug/dL  CMP14+EGFR  Result Value Ref Range   Glucose 88 65 - 99 mg/dL   BUN 13 6 - 24 mg/dL   Creatinine, Ser 0.87 0.57 - 1.00 mg/dL   GFR calc non Af Amer 82 >59 mL/min/1.73   GFR calc Af Amer 95 >59 mL/min/1.73   BUN/Creatinine Ratio 15 9 - 23   Sodium 142 134 - 144 mmol/L   Potassium 3.8 3.5 - 5.2 mmol/L   Chloride 106 96 - 106 mmol/L   CO2 24 20 - 29 mmol/L   Calcium 9.0 8.7 - 10.2 mg/dL   Total Protein 6.6 6.0 - 8.5 g/dL   Albumin 4.2 3.8 - 4.8 g/dL   Globulin, Total 2.4 1.5 - 4.5 g/dL   Albumin/Globulin Ratio 1.8 1.2 - 2.2   Bilirubin Total 0.8 0.0 - 1.2 mg/dL   Alkaline Phosphatase 73 39 - 117 IU/L   AST 18 0 - 40 IU/L   ALT 14 0 - 32 IU/L      Assessment & Plan:   1. DUB (dysfunctional uterine bleeding) - estradiol (ESTRACE) 1 MG tablet; Take 1 mg by mouth daily. - CBC with Differential/Platelet - Iron - CMP14+EGFR   Continue all other maintenance medications as listed  above.  Follow up plan: No follow-ups  on file.  Educational handout given for [place] PA-C [facility] [address]  [place], [place] [address] [phone]   02/19/2019, 1:19 PM

## 2019-02-22 ENCOUNTER — Other Ambulatory Visit: Payer: Self-pay

## 2019-02-22 ENCOUNTER — Encounter: Payer: Self-pay | Admitting: Family

## 2019-02-22 ENCOUNTER — Ambulatory Visit (INDEPENDENT_AMBULATORY_CARE_PROVIDER_SITE_OTHER): Payer: BC Managed Care – PPO | Admitting: Family

## 2019-02-22 DIAGNOSIS — N760 Acute vaginitis: Secondary | ICD-10-CM

## 2019-02-22 DIAGNOSIS — N898 Other specified noninflammatory disorders of vagina: Secondary | ICD-10-CM

## 2019-02-22 DIAGNOSIS — B3731 Acute candidiasis of vulva and vagina: Secondary | ICD-10-CM

## 2019-02-22 DIAGNOSIS — B373 Candidiasis of vulva and vagina: Secondary | ICD-10-CM | POA: Diagnosis not present

## 2019-02-22 MED ORDER — FLUCONAZOLE 150 MG PO TABS: 150.0000 mg | ORAL_TABLET | ORAL | 0 refills | Status: DC | PRN

## 2019-02-22 MED ORDER — PREMARIN 0.625 MG/GM VA CREA: 1.0000 | TOPICAL_CREAM | Freq: Every day | VAGINAL | 12 refills | Status: DC

## 2019-02-22 NOTE — Progress Notes (Signed)
Virtual Visit via telephone Note  I connected with Sharon Foster on 02/22/19 at 8:52 AM by telephone and verified that I am speaking with the correct person using two identifiers. Sharon Foster is currently located at home and no one is currently with her during visit. The provider, [name], FNP is located in their office at time of visit.  I discussed the limitations, risks, security and privacy concerns of performing an evaluation and management service by telephone and the availability of in person appointments. I also discussed with the patient that there may be a patient responsible charge related to this service. The patient expressed understanding and agreed to proceed.   History and Present Illness:  Pt calls the office today with vaginal itching and dysuria. She states she has had abnormal uterine bleeding for the last 4-5 years, but has worsen over the last 6 months. She states she was having heavy bleeding with clots that started 01/29/19 and stopped a few days ago. She reports every times this happens she tends to get a yeast infection.   She reports vaginal itching, vaginal irration, discomfort with wiping, and vaginal odor.  She states she has tried bathing in baby oil every night and OTC creams with no relief.   She is followed by GYN and has a hysterectomy scheduled in the next few weeks.  Vaginal Discharge The patient's primary symptoms include genital itching and vaginal discharge. This is a recurrent problem. The current episode started in the past 7 days. The problem occurs intermittently. The problem has been waxing and waning. Associated symptoms include dysuria. The vaginal discharge was white. She has tried antifungals for the symptoms. The treatment provided mild relief.      Review of Systems  Genitourinary: Positive for dysuria and vaginal discharge.  All other systems reviewed and are negative.    Observations/Objective: No SOB or distress  noted  Assessment and Plan: 1. Vagina itching - fluconazole (DIFLUCAN) 150 MG tablet; Take 1 tablet (150 mg total) by mouth every three (3) days as needed.  Dispense: 3 tablet; Refill: 0  2. Vaginal irritation  - conjugated estrogens (PREMARIN) vaginal cream; Place 1 Applicatorful vaginally daily.  Dispense: 42.5 g; Refill: 12  3. Vagina, candidiasis - fluconazole (DIFLUCAN) 150 MG tablet; Take 1 tablet (150 mg total) by mouth every three (3) days as needed.  Dispense: 3 tablet; Refill: 0  4. Vaginitis and vulvovaginitis - fluconazole (DIFLUCAN) 150 MG tablet; Take 1 tablet (150 mg total) by mouth every three (3) days as needed.  Dispense: 3 tablet; Refill: 0 - conjugated estrogens (PREMARIN) vaginal cream; Place 1 Applicatorful vaginally daily.  Dispense: 42.5 g; Refill: 12  Will give her diflucan rx to help with the yeast Keep clean and dry, long discussion about avoid baby oil baths and applying harsh chemical to her vaginal area as this may be causing some of her problems Also will give premarin cream as it sounds she is having a lot of vaginitis related to hormone changes. She will apply a M&M size once daily for 4-5 days then decrease to 3 times a week Keep all follow up with GYN     I discussed the assessment and treatment plan with the patient. The patient was provided an opportunity to ask questions and all were answered. The patient agreed with the plan and demonstrated an understanding of the instructions.   The patient was advised to call back or seek an in-person evaluation if the symptoms worsen  or if the condition fails to improve as anticipated.  The above assessment and management plan was discussed with the patient. The patient verbalized understanding of and has agreed to the management plan. Patient is aware to call the clinic if symptoms persist or worsen. Patient is aware when to return to the clinic for a follow-up visit. Patient educated on when it is appropriate  to go to the emergency department.   Time call ended:  9:20 AM  I provided 28 minutes of non-face-to-face time during this encounter.    Jannifer Rodneyhristy Deshawn Witty, FNP

## 2019-05-01 ENCOUNTER — Encounter: Payer: Self-pay | Admitting: Family

## 2019-05-01 ENCOUNTER — Ambulatory Visit (INDEPENDENT_AMBULATORY_CARE_PROVIDER_SITE_OTHER): Payer: BC Managed Care – PPO | Admitting: Family

## 2019-05-01 DIAGNOSIS — B373 Candidiasis of vulva and vagina: Secondary | ICD-10-CM

## 2019-05-01 DIAGNOSIS — B3731 Acute candidiasis of vulva and vagina: Secondary | ICD-10-CM

## 2019-05-01 MED ORDER — FLUCONAZOLE 150 MG PO TABS: 150.0000 mg | ORAL_TABLET | ORAL | 0 refills | Status: DC | PRN

## 2019-05-01 MED ORDER — MICONAZOLE 3 200 MG VA SUPP: 200.0000 mg | Freq: Every day | VAGINAL | 0 refills | Status: DC

## 2019-05-01 NOTE — Progress Notes (Signed)
   Virtual Visit via telephone Note Due to COVID-19 pandemic this visit was conducted virtually. This visit type was conducted due to national recommendations for restrictions regarding the COVID-19 Pandemic (e.g. social distancing, sheltering in place) in an effort to limit this patient's exposure and mitigate transmission in our community. All issues noted in this document were discussed and addressed.  A physical exam was not performed with this format.  I connected with Sharon Foster on 05/01/19 at 2:32pm by telephone and verified that I am speaking with the correct person using two identifiers. Sharon Foster is currently located at home and husband is currently with her during visit. The provider, [name], FNP is located in their office at time of visit.  I discussed the limitations, risks, security and privacy concerns of performing an evaluation and management service by telephone and the availability of in person appointments. I also discussed with the patient that there may be a patient responsible charge related to this service. The patient expressed understanding and agreed to proceed.   History and Present Illness:  Vaginal Itching The patient's primary symptoms include genital itching and vaginal discharge. This is a recurrent problem. The current episode started in the past 7 days. The problem occurs intermittently. The problem has been waxing and waning. The pain is mild. Associated symptoms include painful intercourse. Pertinent negatives include no diarrhea, discolored urine, dysuria, flank pain, hematuria or urgency. The vaginal discharge was white and thick. She has tried antifungals for the symptoms. The treatment provided mild relief.      Review of Systems  Gastrointestinal: Negative for diarrhea.  Genitourinary: Positive for vaginal discharge. Negative for dysuria, flank pain, hematuria and urgency.  All other systems reviewed and are negative.     Observations/Objective: No SOB or distress noted  Assessment and Plan: 1. Vagina, candidiasis Keep clean and dry Encouraged daily yogurt Will substrain from sex for 7 days. Husband will use OTC cream as it seems like he has a yeast infection also.  Call office if symptoms worsen or do not improve  - fluconazole (DIFLUCAN) 150 MG tablet; Take 1 tablet (150 mg total) by mouth every three (3) days as needed.  Dispense: 4 tablet; Refill: 0 - miconazole (MICONAZOLE 3) 200 MG vaginal suppository; Place 1 suppository (200 mg total) vaginally at bedtime.  Dispense: 7 suppository; Refill: 0     I discussed the assessment and treatment plan with the patient. The patient was provided an opportunity to ask questions and all were answered. The patient agreed with the plan and demonstrated an understanding of the instructions.   The patient was advised to call back or seek an in-person evaluation if the symptoms worsen or if the condition fails to improve as anticipated.  The above assessment and management plan was discussed with the patient. The patient verbalized understanding of and has agreed to the management plan. Patient is aware to call the clinic if symptoms persist or worsen. Patient is aware when to return to the clinic for a follow-up visit. Patient educated on when it is appropriate to go to the emergency department.   Time call ended:  2:47 pm  I provided 15 minutes of non-face-to-face time during this encounter.    [name], FNP

## 2019-05-16 ENCOUNTER — Other Ambulatory Visit: Payer: Self-pay | Admitting: Family Medicine

## 2019-05-20 ENCOUNTER — Other Ambulatory Visit: Payer: Self-pay | Admitting: Family Medicine

## 2019-06-27 ENCOUNTER — Ambulatory Visit: Payer: BC Managed Care – PPO | Admitting: Family Medicine

## 2019-06-27 ENCOUNTER — Other Ambulatory Visit: Payer: Self-pay

## 2019-06-27 ENCOUNTER — Encounter: Payer: Self-pay | Admitting: Family Medicine

## 2019-06-27 VITALS — BP 122/52 | HR 85 | Temp 99.0°F | Resp 20 | Ht 65.0 in | Wt 209.0 lb

## 2019-06-27 DIAGNOSIS — R300 Dysuria: Secondary | ICD-10-CM | POA: Diagnosis not present

## 2019-06-27 DIAGNOSIS — N898 Other specified noninflammatory disorders of vagina: Secondary | ICD-10-CM

## 2019-06-27 DIAGNOSIS — M25511 Pain in right shoulder: Secondary | ICD-10-CM

## 2019-06-27 LAB — WET PREP FOR TRICH, YEAST, CLUE
Clue Cell Exam: NEGATIVE
Trichomonas Exam: NEGATIVE
Yeast Exam: NEGATIVE

## 2019-06-27 LAB — URINALYSIS, COMPLETE
Bilirubin, UA: NEGATIVE
Glucose, UA: NEGATIVE
Ketones, UA: NEGATIVE
Leukocytes,UA: NEGATIVE
Nitrite, UA: NEGATIVE
Protein,UA: NEGATIVE
RBC, UA: NEGATIVE
Specific Gravity, UA: 1.025 (ref 1.005–1.030)
Urobilinogen, Ur: 0.2 mg/dL (ref 0.2–1.0)
pH, UA: 5.5 (ref 5.0–7.5)

## 2019-06-27 LAB — MICROSCOPIC EXAMINATION
RBC, Urine: NONE SEEN /hpf (ref 0–2)
Renal Epithel, UA: NONE SEEN /hpf

## 2019-06-27 MED ORDER — NAPROXEN 500 MG PO TABS: 500.0000 mg | ORAL_TABLET | Freq: Two times a day (BID) | ORAL | 0 refills | Status: DC

## 2019-06-27 MED ORDER — PREDNISONE 20 MG PO TABS: ORAL_TABLET | ORAL | 0 refills | Status: DC

## 2019-06-27 NOTE — Progress Notes (Signed)
Subjective:  Patient ID: Sharon Foster, female    DOB: [birth year], 43 y.o.   MRN: [id number]  Patient Care Team: Mechele Claude, MD as PCP - General (Family Medicine)   Chief Complaint:  right shoulder pain and Vaginitis   HPI: Sharon Foster is a 43 y.o. female presenting on 06/27/2019 for right shoulder pain and Vaginitis   Pt presents today with complaints of vaginal irritation and discharge with dysuria. Pt states this has been a recurrent problem. States she did have vaginal discharge a few days ago but this has subsided. States she now has vaginal irritation and burning with dysuria. Pt states she does not have abdominal or back pain. States she has not taken antibiotics recently. Last glucose normal.  Pt states she also has right shoulder pain that started 6 weeks ago. 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 States the pain started after this. States she has tried heat and over the counter rubs without complete relief of the symptoms. States she has tried Aleve with some relief of symptoms. States the pain is sharp to catching and 3/10 at present.   Shoulder Pain  The pain is present in the right shoulder. This is a chronic problem. The current episode started more than 1 month ago. There has been no history of extremity trauma. The problem occurs intermittently. The problem has been waxing and waning. The quality of the pain is described as aching. The pain is at a severity of 3/10. The pain is mild. Pertinent negatives include no fever, inability to bear weight, itching, joint locking, joint swelling, limited range of motion, numbness, stiffness or tingling. The symptoms are aggravated by activity. She has tried heat and NSAIDS for the symptoms. The treatment provided mild relief. Family history does not include rheumatoid arthritis. There is no history of diabetes, gout, osteoarthritis or rheumatoid arthritis.  Vaginal Itching The patient's primary  symptoms include genital itching and vaginal discharge. The patient's pertinent negatives include no genital lesions, genital odor, genital rash, missed menses, pelvic pain or vaginal bleeding. This is a recurrent problem. The pain is mild. She is not pregnant. Associated symptoms include dysuria. Pertinent negatives include no abdominal pain, anorexia, back pain, chills, constipation, diarrhea, discolored urine, fever, flank pain, frequency, headaches, hematuria, joint pain, joint swelling, nausea, painful intercourse, rash, sore throat, urgency or vomiting. The vaginal discharge was white. There has been no bleeding. The symptoms are aggravated by intercourse and urinating. She has tried antifungals for the symptoms. The treatment provided mild relief.    Relevant past medical, surgical, family, and social history reviewed and updated as indicated.  Allergies and medications reviewed and updated. Date reviewed: Chart in Epic.   Past Medical History:  Diagnosis Date  . GERD (gastroesophageal reflux disease)     Past Surgical History:  Procedure Laterality Date  . BREAST SURGERY    . LASIK      Social History   Socioeconomic History  . Marital status: Married    Spouse name: Not on file  . Number of children: Not on file  . Years of education: Not on file  . Highest education level: Not on file  Occupational History  . Not on file  Social Needs  . Financial resource strain: Not on file  . Food insecurity    Worry: Not on file    Inability: Not on file  . Transportation needs    Medical: Not on file  Non-medical: Not on file  Tobacco Use  . Smoking status: Never Smoker  . Smokeless tobacco: Never Used  Substance and Sexual Activity  . Alcohol use: No  . Drug use: No  . Sexual activity: Not on file  Lifestyle  . Physical activity    Days per week: Not on file    Minutes per session: Not on file  . Stress: Not on file  Relationships  . Social Musicianconnections    Talks on  phone: Not on file    Gets together: Not on file    Attends religious service: Not on file    Active member of club or organization: Not on file    Attends meetings of clubs or organizations: Not on file    Relationship status: Not on file  . Intimate partner violence    Fear of current or ex partner: Not on file    Emotionally abused: Not on file    Physically abused: Not on file    Forced sexual activity: Not on file  Other Topics Concern  . Not on file  Social History Narrative  . Not on file    Outpatient Encounter Medications as of 06/27/2019  Medication Sig  . pantoprazole (PROTONIX) 40 MG tablet Take 1 tablet (40 mg total) by mouth 2 (two) times daily.  . naproxen (NAPROSYN) 500 MG tablet Take 1 tablet (500 mg total) by mouth 2 (two) times daily with a meal.  . predniSONE (DELTASONE) 20 MG tablet 2 po at sametime daily for 5 days  . [DISCONTINUED] Butenafine HCl 1 % cream Apply twice daily to affected areas (Patient not taking: Reported on 02/22/2019)  . [DISCONTINUED] conjugated estrogens (PREMARIN) vaginal cream Place 1 Applicatorful vaginally daily.  . [DISCONTINUED] estradiol (ESTRACE) 1 MG tablet Take 1 mg by mouth daily.  . [DISCONTINUED] fluconazole (DIFLUCAN) 150 MG tablet Take 1 tablet (150 mg total) by mouth every three (3) days as needed.  . [DISCONTINUED] miconazole (MICONAZOLE 3) 200 MG vaginal suppository Place 1 suppository (200 mg total) vaginally at bedtime.  . [DISCONTINUED] phentermine (ADIPEX-P) 37.5 MG tablet TAKE 1 TABLET BY MOUTH DAILY BEFORE BREAKFAST  . [DISCONTINUED] polyethylene glycol powder (GLYCOLAX/MIRALAX) powder Take 17 g by mouth 2 (two) times daily as needed for moderate constipation. (Patient not taking: Reported on 02/22/2019)   No facility-administered encounter medications on file as of 06/27/2019.     No Known Allergies  Review of Systems  Constitutional: Negative for activity change, appetite change, chills, diaphoresis, fatigue, fever and  unexpected weight change.  HENT: Negative.  Negative for sore throat.   Eyes: Negative.  Negative for photophobia and visual disturbance.  Respiratory: Negative for cough, chest tightness and shortness of breath.   Cardiovascular: Negative for chest pain, palpitations and leg swelling.  Gastrointestinal: Negative for abdominal pain, anorexia, blood in stool, constipation, diarrhea, nausea and vomiting.  Endocrine: Negative.   Genitourinary: Positive for dyspareunia, dysuria and vaginal discharge. Negative for decreased urine volume, difficulty urinating, enuresis, flank pain, frequency, genital sores, hematuria, menstrual problem, missed menses, pelvic pain, urgency, vaginal bleeding and vaginal pain.  Musculoskeletal: Positive for arthralgias (right shoulder). Negative for back pain, gait problem, gout, joint pain, joint swelling, myalgias, neck pain, neck stiffness and stiffness.  Skin: Negative.  Negative for itching and rash.  Allergic/Immunologic: Negative.   Neurological: Negative for dizziness, tingling, tremors, seizures, syncope, facial asymmetry, speech difficulty, weakness, light-headedness, numbness and headaches.  Hematological: Negative.   Psychiatric/Behavioral: Negative for confusion, hallucinations, sleep disturbance and suicidal  ideas.  All other systems reviewed and are negative.       Objective:  BP (!) 122/52   Pulse 85   Temp 99 F (37.2 C)   Resp 20   Ht  (1.651 m)   Wt 209 lb (94.8 kg)   SpO2 100%   BMI 34.78 kg/m    Wt Readings from Last 3 Encounters:  06/27/19 209 lb (94.8 kg)  02/16/19 211 lb (95.7 kg)  10/09/18 213 lb (96.6 kg)    Physical Exam Vitals signs and nursing note reviewed.  Constitutional:      General: She is not in acute distress.    Appearance: Normal appearance. She is well-developed and well-groomed. She is not ill-appearing, toxic-appearing or diaphoretic.  HENT:     Head: Normocephalic and atraumatic.     Jaw: There is  normal jaw occlusion.     Right Ear: Hearing normal.     Left Ear: Hearing normal.     Nose: Nose normal.     Mouth/Throat:     Lips: Pink.     Mouth: Mucous membranes are moist.     Pharynx: Oropharynx is clear. Uvula midline.  Eyes:     General: Lids are normal.     Extraocular Movements: Extraocular movements intact.     Conjunctiva/sclera: Conjunctivae normal.     Pupils: Pupils are equal, round, and reactive to light.  Neck:     Musculoskeletal: Normal range of motion and neck supple.     Thyroid: No thyroid mass, thyromegaly or thyroid tenderness.     Vascular: No carotid bruit or JVD.     Trachea: Trachea and phonation normal.  Cardiovascular:     Rate and Rhythm: Normal rate and regular rhythm.     Chest Wall: PMI is not displaced.     Pulses: Normal pulses.     Heart sounds: Normal heart sounds. No murmur. No friction rub. No gallop.   Pulmonary:     Effort: Pulmonary effort is normal. No respiratory distress.     Breath sounds: Normal breath sounds. No wheezing.  Abdominal:     General: Bowel sounds are normal. There is no distension or abdominal bruit.     Palpations: Abdomen is soft. There is no hepatomegaly or splenomegaly.     Tenderness: There is no abdominal tenderness. There is no right CVA tenderness or left CVA tenderness.     Hernia: No hernia is present.  Genitourinary:    Comments: Exam deferred, pt did self wet prep.  Musculoskeletal:     Right shoulder: She exhibits decreased range of motion (pain with abduction), tenderness and pain. She exhibits no bony tenderness, no swelling, no effusion, no crepitus, no deformity, no laceration, no spasm, normal pulse and normal strength.     Right elbow: Normal.    Cervical back: Normal.       Arms:     Right lower leg: No edema.     Left lower leg: No edema.  Lymphadenopathy:     Cervical: No cervical adenopathy.  Skin:    General: Skin is warm and dry.     Capillary Refill: Capillary refill takes less than  2 seconds.     Coloration: Skin is not cyanotic, jaundiced or pale.     Findings: No rash.  Neurological:     General: No focal deficit present.     Mental Status: She is alert and oriented to person, place, and time.     Cranial Nerves:  Cranial nerves are intact.     Sensory: Sensation is intact.     Motor: Motor function is intact.     Coordination: Coordination is intact.     Gait: Gait is intact.     Deep Tendon Reflexes: Reflexes are normal and symmetric.  Psychiatric:        Attention and Perception: Attention and perception normal.        Mood and Affect: Mood and affect normal.        Speech: Speech normal.        Behavior: Behavior normal. Behavior is cooperative.        Thought Content: Thought content normal.        Cognition and Memory: Cognition and memory normal.        Judgment: Judgment normal.     Results for orders placed or performed in visit on 06/27/19  WET PREP FOR [place], YEAST, CLUE   Specimen: Vaginal Fluid   VAGINAL FLUI  Result Value Ref Range   Trichomonas Exam Negative Negative   Yeast Exam Negative Negative   Clue Cell Exam Negative Negative  Microscopic Examination   URINE  Result Value Ref Range   WBC, UA 0-5 0 - 5 /hpf   RBC None seen 0 - 2 /hpf   Epithelial Cells (non renal) 0-10 0 - 10 /hpf   Renal Epithel, UA None seen None seen /hpf   Bacteria, UA Few None seen/Few  Urinalysis, Complete  Result Value Ref Range   Specific Gravity, UA 1.025 1.005 - 1.030   pH, UA 5.5 5.0 - 7.5   Color, UA Yellow Yellow   Appearance Ur Clear Clear   Leukocytes,UA Negative Negative   Protein,UA Negative Negative/Trace   Glucose, UA Negative Negative   Ketones, UA Negative Negative   RBC, UA Negative Negative   Bilirubin, UA Negative Negative   Urobilinogen, Ur 0.2 0.2 - 1.0 mg/dL   Nitrite, UA Negative Negative   Microscopic Examination See below:        Pertinent labs & imaging results that were available during my care of the patient were  reviewed by me and considered in my medical decision making.  Assessment & Plan:  Darwin was seen today for right shoulder pain and vaginitis.  Diagnoses and all orders for this visit:  Vaginal irritation Wet prep negative for trich, BV, or yeast. Will send urine for GC and chlamydia. Irritation may be due to vaginal dryness. Pt given information on vaginal hygiene and available over the counter lubricants. Pt aware to report any new, worsening, or persistent symptoms.  -     Urinalysis, Complete -     WET PREP FOR TRICH, YEAST, CLUE -     Chlamydia/Gonococcus/Trichomonas, NAA  Dysuria Urinalysis unremarkable in office, will add culture and treat if warranted.  -     Urinalysis, Complete -     WET PREP FOR TRICH, YEAST, CLUE  Nontraumatic shoulder pain, right Reported symptoms and exam findings consistent with strain of shoulder. No deformity, crepitus, swelling, or erythema. Pt declines PT. Will treat with oral steroids and NSAIDs. Report any new, worsening, or persistent symptoms. Medications as prescribed. -     predniSONE (DELTASONE) 20 MG tablet; 2 po at sametime daily for 5 days -     naproxen (NAPROSYN) 500 MG tablet; Take 1 tablet (500 mg total) by mouth 2 (two) times daily with a meal.     Continue all other maintenance medications.  Follow up plan: Return  if symptoms worsen or fail to improve.  Continue healthy lifestyle choices, including diet (rich in fruits, vegetables, and lean proteins, and low in salt and simple carbohydrates) and exercise (at least 30 minutes of moderate physical activity daily).  Educational handout given for vaginal hygiene  The above assessment and management plan was discussed with the patient. The patient verbalized understanding of and has agreed to the management plan. Patient is aware to call the clinic if they develop any new symptoms or if symptoms persist or worsen. Patient is aware when to return to the clinic for a follow-up visit.  Patient educated on when it is appropriate to go to the emergency department.   Kari Baars, FNP-C [facility] [phone]

## 2019-06-27 NOTE — Patient Instructions (Addendum)
Sharon Foster, [employer]  Healthy vaginal hygiene practices   -  Avoid sleeper pajamas. Nightgowns allow air to circulate.  Sleep without underpants whenever possible.  -  Wear cotton underpants during the day. Double-rinse underwear after washing to avoid residual irritants. Do not use fabric softeners for underwear and swimsuits.  - Avoid tights, leotards, leggings, "skinny" jeans, and other tight-fitting clothing. Skirts and loose-fitting pants allow air to circulate.  - Avoid pantyliners.  Instead use tampons or cotton pads.  - Daily warm bathing is helpful:     - Soak in clean water (no soap) for 10 to 15 minutes.     - Use soap to wash regions other than the genital area just before getting out of the tub or drain water and take a shower to wash your body. Limit use of any soap on genital areas. Use   fragance-free soaps.     - Rinse the genital area well and gently pat dry.  Don't rub.  Hair dryer to assist with drying can be used only if on cool setting.     - Do not use bubble baths or perfumed soaps.  - Do not use any feminine sprays, douches or powders.  These contain chemicals that will irritate the skin.  - If the genital area is tender or swollen, cool compresses may relieve the discomfort. Unscented wet wipes can be used instead of toilet paper for wiping.   - Emollients, such as Vaseline, may help protect skin and can be applied to the irritated area.  - Always remember to wipe front-to-back after bowel movements. Pat dry after urination.  - Do not sit in wet swimsuits for long periods of time after swimming

## 2019-06-28 LAB — URINE CULTURE

## 2019-06-29 ENCOUNTER — Encounter: Payer: Self-pay | Admitting: Family Medicine

## 2019-06-30 LAB — CHLAMYDIA/GONOCOCCUS/TRICHOMONAS, NAA
Chlamydia by NAA: NEGATIVE
Gonococcus by NAA: NEGATIVE
Trich vag by NAA: NEGATIVE

## 2019-07-26 ENCOUNTER — Telehealth: Payer: Self-pay | Admitting: Family Medicine

## 2019-07-26 NOTE — Telephone Encounter (Signed)
Pt scheduled for televisit with Hendricks Limes tomorrow at 8:20.

## 2019-07-26 NOTE — Telephone Encounter (Signed)
Patient called stating that she believes she could have a bad yeast infection because she is still in a lot of pain and is now experiencing a bad vaginal odor since being seen at her last visit on 06/27/19. Wants to be seen in person asap. Offered patient the 1st available in person visit which is 07/31/19 but pt says she really needs to be seen before the weekend. Wants to speak with nurse about getting in soon.

## 2019-07-27 ENCOUNTER — Encounter: Payer: Self-pay | Admitting: Family Medicine

## 2019-07-27 ENCOUNTER — Ambulatory Visit (INDEPENDENT_AMBULATORY_CARE_PROVIDER_SITE_OTHER): Payer: BC Managed Care – PPO | Admitting: Family Medicine

## 2019-07-27 DIAGNOSIS — B9689 Other specified bacterial agents as the cause of diseases classified elsewhere: Secondary | ICD-10-CM | POA: Diagnosis not present

## 2019-07-27 DIAGNOSIS — N760 Acute vaginitis: Secondary | ICD-10-CM | POA: Diagnosis not present

## 2019-07-27 MED ORDER — METRONIDAZOLE 500 MG PO TABS: 500.0000 mg | ORAL_TABLET | Freq: Two times a day (BID) | ORAL | 0 refills | Status: DC

## 2019-07-27 NOTE — Patient Instructions (Signed)

## 2019-07-27 NOTE — Progress Notes (Signed)
Virtual Visit via Telephone Note  I connected with Sharon Foster on 07/27/19 at 8:13 AM by telephone and verified that I am speaking with the correct person using two identifiers. Sharon Foster is currently located at work and nobody is currently with her during this visit. The provider, Loman Brooklyn, FNP is located in their home at time of visit.  I discussed the limitations, risks, security and privacy concerns of performing an evaluation and management service by telephone and the availability of in person appointments. I also discussed with the patient that there may be a patient responsible charge related to this service. The patient expressed understanding and agreed to proceed.  Subjective: PCP: Claretta Fraise, MD  Chief Complaint  Patient presents with  . Vaginal Discharge   Patient concerned about foul-smelling vaginal discharge. She does admit that it smells quite fishy. The discharge is milky colored and not thick or clumpy. She reports she doesn't notice the discharge a lot during the day but it covers her husband when they have sex. She reports her skin feels irritated and it hurts when she wipes. She and her husband tried to use a lubricant during sex recently and she reports it felt like someone was stabbing her. She denies any vaginal itching. She is concerned that she keeps having these reoccurring infections after sex with her husband but not the man she is having an affair with.   ROS: Per HPI  Current Outpatient Medications:  .  metroNIDAZOLE (FLAGYL) 500 MG tablet, Take 1 tablet (500 mg total) by mouth 2 (two) times daily., Disp: 14 tablet, Rfl: 0 .  naproxen (NAPROSYN) 500 MG tablet, Take 1 tablet (500 mg total) by mouth 2 (two) times daily with a meal., Disp: 60 tablet, Rfl: 0 .  pantoprazole (PROTONIX) 40 MG tablet, Take 1 tablet (40 mg total) by mouth 2 (two) times daily., Disp: 180 tablet, Rfl: 0  No Known Allergies Past Medical History:  Diagnosis  Date  . GERD (gastroesophageal reflux disease)     Observations/Objective: A&O  No respiratory distress or wheezing audible over the phone Mood, judgement, and thought processes all WNL  Assessment and Plan: 1. Bacterial vaginitis - Education provided on bacterial vaginosis.  Encourage patient to discuss reoccurring BV with her OB/GYN.  Discussed use of condoms.  Advised not to drink any alcohol while taking Flagyl.  Patient to avoid products in her vaginal area until it is healed up.  Recommended soaps with no perfumes. - metroNIDAZOLE (FLAGYL) 500 MG tablet; Take 1 tablet (500 mg total) by mouth 2 (two) times daily.  Dispense: 14 tablet; Refill: 0   Follow Up Instructions:  I discussed the assessment and treatment plan with the patient. The patient was provided an opportunity to ask questions and all were answered. The patient agreed with the plan and demonstrated an understanding of the instructions.   The patient was advised to call back or seek an in-person evaluation if the symptoms worsen or if the condition fails to improve as anticipated.  The above assessment and management plan was discussed with the patient. The patient verbalized understanding of and has agreed to the management plan. Patient is aware to call the clinic if symptoms persist or worsen. Patient is aware when to return to the clinic for a follow-up visit. Patient educated on when it is appropriate to go to the emergency department.   Time call ended: 8:29 AM  I provided 20 minutes of non-face-to-face time during this  encounter.  Deliah Boston, MSN, APRN, FNP-C [facility] 07/27/19

## 2019-08-10 ENCOUNTER — Other Ambulatory Visit: Payer: Self-pay | Admitting: Family Medicine

## 2019-08-10 DIAGNOSIS — M25511 Pain in right shoulder: Secondary | ICD-10-CM

## 2019-08-23 ENCOUNTER — Other Ambulatory Visit: Payer: Self-pay | Admitting: Family Medicine

## 2019-10-15 ENCOUNTER — Other Ambulatory Visit: Payer: Self-pay

## 2019-10-16 ENCOUNTER — Encounter: Payer: Self-pay | Admitting: Family

## 2019-10-16 ENCOUNTER — Ambulatory Visit: Payer: BC Managed Care – PPO | Admitting: Family

## 2019-10-16 VITALS — BP 112/68 | HR 81 | Temp 98.4°F | Ht 65.0 in | Wt 213.0 lb

## 2019-10-16 DIAGNOSIS — R6889 Other general symptoms and signs: Secondary | ICD-10-CM | POA: Diagnosis not present

## 2019-10-16 DIAGNOSIS — M79659 Pain in unspecified thigh: Secondary | ICD-10-CM

## 2019-10-16 DIAGNOSIS — K219 Gastro-esophageal reflux disease without esophagitis: Secondary | ICD-10-CM | POA: Insufficient documentation

## 2019-10-16 DIAGNOSIS — E669 Obesity, unspecified: Secondary | ICD-10-CM | POA: Diagnosis not present

## 2019-10-16 DIAGNOSIS — I7300 Raynaud's syndrome without gangrene: Secondary | ICD-10-CM | POA: Diagnosis not present

## 2019-10-16 DIAGNOSIS — Z6841 Body Mass Index (BMI) 40.0 and over, adult: Secondary | ICD-10-CM | POA: Insufficient documentation

## 2019-10-16 MED ORDER — FAMOTIDINE 20 MG PO TABS: 20.0000 mg | ORAL_TABLET | Freq: Every day | ORAL | 1 refills | Status: DC

## 2019-10-16 NOTE — Patient Instructions (Signed)
Raynaud Phenomenon ° °Raynaud phenomenon is a condition that affects the blood vessels (arteries) that carry blood to your fingers and toes. The arteries that supply blood to your ears, lips, nipples, or the tip of your nose might also be affected. Raynaud phenomenon causes the arteries to become narrow temporarily (spasm). As a result, the flow of blood to the affected areas is temporarily decreased. This usually occurs in response to cold temperatures or stress. During an attack, the skin in the affected areas turns white, then blue, and finally red. You may also feel tingling or numbness in those areas. °Attacks usually last for only a brief period, and then the blood flow to the area returns to normal. In most cases, Raynaud phenomenon does not cause serious health problems. °What are the causes? °In many cases, the cause of this condition is not known. The condition may occur on its own (primary Raynaud phenomenon) or may be associated with other diseases or factors (secondary Raynaud phenomenon). °Possible causes may include: °· Diseases or medical conditions that damage the arteries. °· Injuries and repetitive actions that hurt the hands or feet. °· Being exposed to certain chemicals. °· Taking medicines that narrow the arteries. °· Other medical conditions, such as lupus, scleroderma, rheumatoid arthritis, thyroid problems, blood disorders, Sjogren syndrome, or atherosclerosis. °What increases the risk? °The following factors may make you more likely to develop this condition: °· Being 20-40 years old. °· Being female. °· Having a family history of Raynaud phenomenon. °· Living in a cold climate. °· Smoking. °What are the signs or symptoms? °Symptoms of this condition usually occur when you are exposed to cold temperatures or when you have emotional stress. The symptoms may last for a few minutes or up to several hours. They usually affect your fingers but may also affect your toes, nipples, lips, ears, or  the tip of your nose. Symptoms may include: °· Changes in skin color. The skin in the affected areas will turn pale or white. The skin may then change from white to bluish to red as normal blood flow returns to the area. °· Numbness, tingling, or pain in the affected areas. °In severe cases, symptoms may include: °· Skin sores. °· Tissues decaying and dying (gangrene). °How is this diagnosed? °This condition may be diagnosed based on: °· Your symptoms and medical history. °· A physical exam. During the exam, you may be asked to put your hands in cold water to check for a reaction to cold temperature. °· Tests, such as: °? Blood tests to check for other diseases or conditions. °? A test to check the movement of blood through your arteries and veins (vascular ultrasound). °? A test in which the skin at the base of your fingernail is examined under a microscope (nailfold capillaroscopy). °How is this treated? °Treatment for this condition often involves making lifestyle changes and taking steps to control your exposure to cold temperatures. For more severe cases, medicine (calcium channel blockers) may be used to improve blood flow. Surgery is sometimes done to block the nerves that control the affected arteries, but this is rare. °Follow these instructions at home: °Avoiding cold temperatures °Take these steps to avoid exposure to cold: °· If possible, stay indoors during cold weather. °· When you go outside during cold weather, dress in layers and wear mittens, a hat, a scarf, and warm footwear. °· Wear mittens or gloves when handling ice or frozen food. °· Use holders for glasses or cans containing cold drinks. °·   Let warm water run for a while before taking a shower or bath. °· Warm up the car before driving in cold weather. °Lifestyle ° °· If possible, avoid stressful and emotional situations. Try to find ways to manage your stress, such as: °? Exercise. °? Yoga. °? Meditation. °? Biofeedback. °· Do not use any  products that contain nicotine or tobacco, such as cigarettes and e-cigarettes. If you need help quitting, ask your health care provider. °· Avoid secondhand smoke. °· Limit your use of caffeine. °? Switch to decaffeinated coffee, tea, and soda. °? Avoid chocolate. °· Avoid vibrating tools and machinery. °General instructions °· Protect your hands and feet from injuries, cuts, or bruises. °· Avoid wearing tight rings or wristbands. °· Wear loose fitting socks and comfortable, roomy shoes. °· Take over-the-counter and prescription medicines only as told by your health care provider. °Contact a health care provider if: °· Your discomfort becomes worse despite lifestyle changes. °· You develop sores on your fingers or toes that do not heal. °· Your fingers or toes turn black. °· You have breaks in the skin on your fingers or toes. °· You have a fever. °· You have pain or swelling in your joints. °· You have a rash. °· Your symptoms occur on only one side of your body. °Summary °· Raynaud phenomenon is a condition that affects the arteries that carry blood to your fingers, toes, ears, lips, nipples, or the tip of your nose. °· In many cases, the cause of this condition is not known. °· Symptoms of this condition include changes in skin color, and numbness and tingling of the affected area. °· Treatment for this condition includes lifestyle changes, reducing exposure to cold temperatures, and using medicines for severe cases of the condition. °· Contact your health care provider if your condition worsens despite treatment. °This information is not intended to replace advice given to you by your health care provider. Make sure you discuss any questions you have with your health care provider. °Document Revised: 08/12/2017 Document Reviewed: 09/20/2016 °Elsevier Patient Education © 2020 [employer] ° °

## 2019-10-16 NOTE — Progress Notes (Signed)
Subjective:    Patient ID: Sharon Foster, female    DOB: Aug 24, 1975, 44 y.o.   MRN: [id number]  Chief Complaint  Patient presents with  . Cold Extremity    fingers and toes   . Bleeding/Bruising  . Heartburn   Pt presents to the office today with cold sensitivity. She reports she was diagnosed with Raynaud about a year ago, but feels like it has worsen over the last 2-3 weeks.  She states the tips of her fingers will turn blue/purple then white. She states she continues to wear thick socks, but does not help with her toes.   She is complaining of right thigh pain that started 22 years ago, but has worsen over the last few years.  She reports she has to stand for long periods of time. She reports intermittent aching pain of 3 out 10 that is worse in the evenings.   She also reports increase bruising over the last year.   Heartburn She complains of heartburn. She reports no belching, no coughing, no early satiety or no globus sensation. This is a chronic problem. The current episode started more than 1 year ago. The problem occurs frequently. The problem has been gradually worsening. Risk factors include obesity. She has tried a PPI for the symptoms. The treatment provided mild relief.      Review of Systems  Respiratory: Negative for cough.   Gastrointestinal: Positive for heartburn.  All other systems reviewed and are negative.      Objective:   Physical Exam Vitals reviewed.  Constitutional:      General: She is not in acute distress.    Appearance: She is well-developed. She is obese.  HENT:     Head: Normocephalic and atraumatic.  Eyes:     Pupils: Pupils are equal, round, and reactive to light.  Neck:     Thyroid: No thyromegaly.  Cardiovascular:     Rate and Rhythm: Normal rate and regular rhythm.     Heart sounds: Normal heart sounds. No murmur.  Pulmonary:     Effort: Pulmonary effort is normal. No respiratory distress.     Breath sounds: Normal breath  sounds. No wheezing.  Abdominal:     General: Bowel sounds are normal. There is no distension.     Palpations: Abdomen is soft.     Tenderness: There is no abdominal tenderness.  Musculoskeletal:        General: No tenderness. Normal range of motion.     Cervical back: Normal range of motion and neck supple.  Skin:    General: Skin is warm and dry.     Comments: Bilateral hands and feet cold   Neurological:     Mental Status: She is alert and oriented to person, place, and time.     Cranial Nerves: No cranial nerve deficit.     Deep Tendon Reflexes: Reflexes are normal and symmetric.  Psychiatric:        Behavior: Behavior normal.        Thought Content: Thought content normal.        Judgment: Judgment normal.     BP 112/68   Pulse 81   Temp 98.4 F (36.9 C) (Temporal)   Ht '5\' 5"'  (1.651 m)   Wt 213 lb (96.6 kg)   SpO2 100%   BMI 35.45 kg/m        Assessment & Plan:  Sharon Foster comes in today with chief complaint of Cold Extremity (fingers and  toes ), Bleeding/Bruising, and Heartburn   Diagnosis and orders addressed:  1. Raynaud's disease without gangrene Wear mittens Avoid cold weather Avoid caffeine  - Ambulatory referral to Vascular Surgery - CMP14+EGFR - CBC with Differential/Platelet - TSH  2. Obesity, unspecified classification, unspecified obesity type, unspecified whether serious comorbidity present - CMP14+EGFR - CBC with Differential/Platelet - TSH  3. Gastroesophageal reflux disease, unspecified whether esophagitis present Will do referral to GI Will add Pepcid today -Diet discussed- Avoid fried, spicy, citrus foods, caffeine and alcohol -Do not eat 2-3 hours before bedtime -Encouraged small frequent meals -Avoid NSAID's - Ambulatory referral to Gastroenterology - CMP14+EGFR - CBC with Differential/Platelet - TSH - famotidine (PEPCID) 20 MG tablet; Take 1 tablet (20 mg total) by mouth at bedtime.  Dispense: 90 tablet; Refill: 1  4.  Cold sensitivity - CMP14+EGFR - CBC with Differential/Platelet - TSH  5. Pain of thigh, unspecified laterality Start wearing compression hose - Ambulatory referral to Vascular Surgery - CMP14+EGFR - CBC with Differential/Platelet - TSH - Compression stockings   Labs pending Diet and exercise encouraged  Follow up plan: As needed  Sharon Dun, FNP

## 2019-10-17 LAB — CBC WITH DIFFERENTIAL/PLATELET
Basophils Absolute: 0.1 10*3/uL (ref 0.0–0.2)
Basos: 1 %
EOS (ABSOLUTE): 0.1 10*3/uL (ref 0.0–0.4)
Eos: 1 %
Hematocrit: 39.8 % (ref 34.0–46.6)
Hemoglobin: 14.1 g/dL (ref 11.1–15.9)
Immature Grans (Abs): 0 10*3/uL (ref 0.0–0.1)
Immature Granulocytes: 0 %
Lymphocytes Absolute: 2.5 10*3/uL (ref 0.7–3.1)
Lymphs: 42 %
MCH: 33.2 pg — ABNORMAL HIGH (ref 26.6–33.0)
MCHC: 35.4 g/dL (ref 31.5–35.7)
MCV: 94 fL (ref 79–97)
Monocytes Absolute: 0.4 10*3/uL (ref 0.1–0.9)
Monocytes: 6 %
Neutrophils Absolute: 2.9 10*3/uL (ref 1.4–7.0)
Neutrophils: 50 %
Platelets: 188 10*3/uL (ref 150–450)
RBC: 4.25 x10E6/uL (ref 3.77–5.28)
RDW: 11.5 % — ABNORMAL LOW (ref 11.7–15.4)
WBC: 5.9 10*3/uL (ref 3.4–10.8)

## 2019-10-17 LAB — CMP14+EGFR
ALT: 9 IU/L (ref 0–32)
AST: 15 IU/L (ref 0–40)
Albumin/Globulin Ratio: 1.6 (ref 1.2–2.2)
Albumin: 4.1 g/dL (ref 3.8–4.8)
Alkaline Phosphatase: 67 IU/L (ref 39–117)
BUN/Creatinine Ratio: 12 (ref 9–23)
BUN: 11 mg/dL (ref 6–24)
Bilirubin Total: 0.6 mg/dL (ref 0.0–1.2)
CO2: 22 mmol/L (ref 20–29)
Calcium: 9.1 mg/dL (ref 8.7–10.2)
Chloride: 104 mmol/L (ref 96–106)
Creatinine, Ser: 0.95 mg/dL (ref 0.57–1.00)
GFR calc Af Amer: 85 mL/min/{1.73_m2} (ref >59)
GFR calc non Af Amer: 74 mL/min/{1.73_m2} (ref >59)
Globulin, Total: 2.5 g/dL (ref 1.5–4.5)
Glucose: 78 mg/dL (ref 65–99)
Potassium: 3.8 mmol/L (ref 3.5–5.2)
Sodium: 141 mmol/L (ref 134–144)
Total Protein: 6.6 g/dL (ref 6.0–8.5)

## 2019-10-17 LAB — TSH: TSH: 0.955 u[IU]/mL (ref 0.450–4.500)

## 2019-10-24 ENCOUNTER — Other Ambulatory Visit: Payer: Self-pay | Admitting: *Deleted

## 2019-10-24 DIAGNOSIS — M79651 Pain in right thigh: Secondary | ICD-10-CM

## 2019-10-29 ENCOUNTER — Other Ambulatory Visit: Payer: Self-pay

## 2019-10-29 ENCOUNTER — Encounter: Payer: Self-pay | Admitting: Surgery

## 2019-10-29 ENCOUNTER — Ambulatory Visit: Payer: BC Managed Care – PPO | Admitting: Surgery

## 2019-10-29 ENCOUNTER — Ambulatory Visit (HOSPITAL_COMMUNITY)
Admission: RE | Admit: 2019-10-29 | Discharge: 2019-10-29 | Disposition: A | Discharge status: Home / Self Care | Payer: BC Managed Care – PPO | Source: Ambulatory Visit | Attending: Surgery | Admitting: Surgery

## 2019-10-29 VITALS — BP 112/64 | HR 75 | Temp 97.7°F | Resp 20 | Ht 65.0 in | Wt 214.0 lb

## 2019-10-29 DIAGNOSIS — M79651 Pain in right thigh: Secondary | ICD-10-CM | POA: Diagnosis not present

## 2019-10-29 DIAGNOSIS — I83893 Varicose veins of bilateral lower extremities with other complications: Secondary | ICD-10-CM | POA: Diagnosis not present

## 2019-10-29 DIAGNOSIS — I7300 Raynaud's syndrome without gangrene: Secondary | ICD-10-CM | POA: Diagnosis not present

## 2019-10-29 MED ORDER — NIFEDIPINE 10 MG PO CAPS: 10.0000 mg | ORAL_CAPSULE | Freq: Three times a day (TID) | ORAL | Status: DC

## 2019-10-29 MED ORDER — NIFEDIPINE 10 MG PO CAPS: 10.0000 mg | ORAL_CAPSULE | Freq: Three times a day (TID) | ORAL | 6 refills | Status: DC

## 2019-10-29 NOTE — Progress Notes (Signed)
[facility]  Patient name: Sharon Foster MRN: [id number] DOB: 1976-05-31 Sex: female   REQUESTING PROVIDER:    Jannifer Rodney   REASON FOR CONSULT:    Thigh pain  HISTORY OF PRESENT ILLNESS:   Sharon Foster is a 44 y.o. female, who is referred for evaluation of possible Raynaud's.  Patient states that her hands and feet get really cold and turned blue.  Her skin is also thinning out so she bleeds frequently.  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  She has been wearing gloves and socks which have not provided her much benefit.  She also complains about pain and swelling in her right leg as well as significant varicose veins.  She was seen in [place] about 20 years ago.  She states they did some kind of work-up but said she needed vein stripping, but she never had it done.  She has tried to wear compression socks in the past but currently is not.  PAST MEDICAL HISTORY    Past Medical History:  Diagnosis Date  . GERD (gastroesophageal reflux disease)      FAMILY HISTORY   Family History  Problem Relation Age of Onset  . Hypertension Mother   . Cancer Father        Prostate  . Hypertension Father     SOCIAL HISTORY:   Social History   Socioeconomic History  . Marital status: Married    Spouse name: Not on file  . Number of children: Not on file  . Years of education: Not on file  . Highest education level: Not on file  Occupational History  . Not on file  Tobacco Use  . Smoking status: Never Smoker  . Smokeless tobacco: Never Used  Substance and Sexual Activity  . Alcohol use: No  . Drug use: No  . Sexual activity: Not on file  Other Topics Concern  . Not on file  Social History Narrative  . Not on file   Social Determinants of Health   Financial Resource Strain:   . Difficulty of Paying Living Expenses: Not on file  Food Insecurity:   . Worried About Programme researcher, broadcasting/film/video  in the Last Year: Not on file  . Ran Out of Food in the Last Year: Not on file  Transportation Needs:   . Lack of Transportation (Medical): Not on file  . Lack of Transportation (Non-Medical): Not on file  Physical Activity:   . Days of Exercise per Week: Not on file  . Minutes of Exercise per Session: Not on file  Stress:   . Feeling of Stress : Not on file  Social Connections:   . Frequency of Communication with Friends and Family: Not on file  . Frequency of Social Gatherings with Friends and Family: Not on file  . Attends Religious Services: Not on file  . Active Member of Clubs or Organizations: Not on file  . Attends Banker Meetings: Not on file  . Marital Status: Not on file  Intimate Partner Violence:   . Fear of Current or Ex-Partner: Not on file  . Emotionally Abused: Not on file  . Physically Abused: Not on file  . Sexually Abused: Not on file    ALLERGIES:    No Known Allergies  CURRENT MEDICATIONS:    Current Outpatient Medications  Medication Sig Dispense Refill  . pantoprazole (PROTONIX) 40 MG tablet TAKE 1 TABLET BY MOUTH TWICE A DAY 180  tablet 0  . phentermine (ADIPEX-P) 37.5 MG tablet Take 37.5 mg by mouth every morning.     No current facility-administered medications for this visit.    REVIEW OF SYSTEMS:   [X]  denotes positive finding, [ ]  denotes negative finding Cardiac  Comments:  Chest pain or chest pressure:    Shortness of breath upon exertion:    Short of breath when lying flat:    Irregular heart rhythm:        Vascular    Pain in calf, thigh, or hip brought on by ambulation:    Pain in feet at night that wakes you up from your sleep:     Blood clot in your veins:    Leg swelling:         Pulmonary    Oxygen at home:    Productive cough:     Wheezing:         Neurologic    Sudden weakness in arms or legs:     Sudden numbness in arms or legs:     Sudden onset of difficulty speaking or slurred speech:    Temporary  loss of vision in one eye:     Problems with dizziness:         Gastrointestinal    Blood in stool:      Vomited blood:         Genitourinary    Burning when urinating:     Blood in urine:        Psychiatric    Major depression:         Hematologic    Bleeding problems:    Problems with blood clotting too easily:        Skin    Rashes or ulcers:        Constitutional    Fever or chills:     PHYSICAL EXAM:   Vitals:   10/29/19 0839  BP: 112/64  Pulse: 75  Resp: 20  Temp: 97.7 F (36.5 C)  SpO2: 100%  Weight: 214 lb (97.1 kg)  Height: 5\' 5"  (1.651 m)    GENERAL: The patient is a well-nourished female, in no acute distress. The vital signs are documented above. CARDIAC: There is a regular rate and rhythm.  VASCULAR: Palpable pedal pulses.  Moderate size varicosities in the right leg with lower extremity edema PULMONARY: Nonlabored respirations ABDOMEN: Soft and non-tender with normal pitched bowel sounds.  MUSCULOSKELETAL: There are no major deformities or cyanosis. NEUROLOGIC: No focal weakness or paresthesias are detected. SKIN: There are no ulcers or rashes noted. PSYCHIATRIC: The patient has a normal affect.  STUDIES:   I have reviewed the following:  +-------+-----------+-----------+------------+------------+  ABI/TBIToday's ABIToday's TBIPrevious ABIPrevious TBI  +-------+-----------+-----------+------------+------------+  Right 1.13    1.18                  +-------+-----------+-----------+------------+------------+  Left  1.12    0.84                  +-------+-----------+-----------+------------+------------+    Toe indices may be inaccurate due to poor waveform.  Feet and toes were warmed for 3 minutes without change in waveform.    Summary:  Right: Resting right ankle-brachial index is within normal range. No  evidence of significant right lower extremity arterial disease.   Left:  Resting left ankle-brachial index is within normal range. No  evidence of significant left lower extremity arterial disease.   ASSESSMENT and PLAN   Raynaud's: She had  a normal arterial study today, which suggest this is not an obstructive issue for her.  I am starting her on nifedipine to see if she gets any relief.  We obviously discussed the importance of avoiding cold temperatures and wearing socks and gloves to help minimize environmental exposures.  Leg swelling: Apparently the patient was a candidate for vein stripping in [place] a long time ago.  She now complains of pain in her large-caliber veins in her upper thigh as well as swelling.  I am placing her in 20-30 thigh-high compression socks today.  I will have her get a venous reflux study and return to see [place] in 3 months.   Leia Alf, MD, FACS [facility] [phone] Pager [phone]

## 2019-10-30 ENCOUNTER — Other Ambulatory Visit: Payer: Self-pay | Admitting: *Deleted

## 2019-10-30 DIAGNOSIS — I83893 Varicose veins of bilateral lower extremities with other complications: Secondary | ICD-10-CM

## 2019-11-26 ENCOUNTER — Other Ambulatory Visit: Payer: Self-pay | Admitting: Family Medicine

## 2020-01-22 ENCOUNTER — Other Ambulatory Visit: Payer: Self-pay | Admitting: Family Medicine

## 2020-01-31 ENCOUNTER — Encounter: Payer: Self-pay | Admitting: Nurse Practitioner

## 2020-01-31 ENCOUNTER — Other Ambulatory Visit: Payer: Self-pay

## 2020-01-31 ENCOUNTER — Ambulatory Visit: Payer: BC Managed Care – PPO | Admitting: Nurse Practitioner

## 2020-01-31 VITALS — BP 116/68 | HR 79 | Temp 97.4°F | Ht 65.0 in | Wt 207.0 lb

## 2020-01-31 DIAGNOSIS — R202 Paresthesia of skin: Secondary | ICD-10-CM | POA: Diagnosis not present

## 2020-01-31 DIAGNOSIS — R233 Spontaneous ecchymoses: Secondary | ICD-10-CM

## 2020-01-31 DIAGNOSIS — R238 Other skin changes: Secondary | ICD-10-CM | POA: Diagnosis not present

## 2020-01-31 DIAGNOSIS — R234 Changes in skin texture: Secondary | ICD-10-CM | POA: Diagnosis not present

## 2020-01-31 NOTE — Assessment & Plan Note (Signed)
Patient is a 44 year old female she reports to clinic today with concerns about bleeding and bruising which started about 3 months ago.  Bruising is not well controlled.  Patient reports she bruises on anything that comes in contact with her skin.  Patient is not taking any medication that will cause her to bleed.  Patient has no family history significant for bleeding disorders.  Prior to this patient reports heavy bleeding which she has since undergone uterine ablation procedure August 2020.  Patient is concerned about current changes to skin.  Provided education to patient. Labs ordered-PT/PTT, CBC, B12, folate, TSH. Results pending Patient knows to follow-up with worsening or unresolved symptoms.

## 2020-01-31 NOTE — Patient Instructions (Signed)
Paresthesia Paresthesia is a burning or prickling feeling. This feeling can happen in any part of the body. It often happens in the hands, arms, legs, or feet. Usually, it is not painful. In most cases, the feeling goes away in a short time and is not a sign of a serious problem. If you have paresthesia that lasts a long time, you may need to be seen by your doctor. Follow these instructions at home: Alcohol use   Do not drink alcohol if: ? Your doctor tells you not to drink. ? You are pregnant, may be pregnant, or are planning to become pregnant.  If you drink alcohol: ? Limit how much you use to:  0-1 drink a day for women.  0-2 drinks a day for men. ? Be aware of how much alcohol is in your drink. In the [place] one drink equals one 12 oz bottle of beer (355 mL), one 5 oz glass of wine (148 mL), or one 1 oz glass of hard liquor (44 mL). Nutrition   Eat a healthy diet. This includes: ? Eating foods that have a lot of fiber in them, such as fresh fruits and vegetables, whole grains, and beans. ? Limiting foods that have a lot of fat and processed sugars in them, such as fried or sweet foods. General instructions  Take over-the-counter and prescription medicines only as told by your doctor.  Do not use any products that have nicotine or tobacco in them, such as cigarettes and e-cigarettes. If you need help quitting, ask your doctor.  If you have diabetes, work with your doctor to make sure your blood sugar stays in a healthy range.  If your feet feel numb: ? Check for redness, warmth, and swelling every day. ? Wear padded socks and comfortable shoes. These help protect your feet.  Keep all follow-up visits as told by your doctor. This is important. Contact a doctor if:  You have paresthesia that gets worse or does not go away.  Your burning or prickling feeling gets worse when you walk.  You have pain or cramps.  You feel dizzy.  You have a rash. Get help right away if  you:  Feel weak.  Have trouble walking or moving.  Have problems speaking, understanding, or seeing.  Feel confused.  Cannot control when you pee (urinate) or poop (have a bowel movement).  Lose feeling (have numbness) after an injury.  Have new weakness in an arm or leg.  Pass out (faint). Summary  Paresthesia is a burning or prickling feeling. It often happens in the hands, arms, legs, or feet.  In most cases, the feeling goes away in a short time and is not a sign of a serious problem.  If you have paresthesia that lasts a long time, you may need to be seen by your doctor. This information is not intended to replace advice given to you by your health care provider. Make sure you discuss any questions you have with your health care provider. Document Revised: 09/04/2018 Document Reviewed: 08/18/2017 Elsevier Patient Education  2020 [employer]vier Inc.  

## 2020-01-31 NOTE — Assessment & Plan Note (Signed)
Paresthesia bilateral upper and lower extremities not well controlled.  This is new for patient in the last 3 months.TSH,  B12 and folate labs collected. Results pending.

## 2020-01-31 NOTE — Progress Notes (Signed)
Acute Office Visit  Subjective:    Patient ID: Sharon Foster, female    DOB: October 07, 1975, 44 y.o.   MRN: [id number]  Chief Complaint  Patient presents with  . Bleeding/Bruising    HPI Patient is in today for GERD, Follow up:  The patient was last seen for GERD 06/21/2018. Changes made since that visit include continue Protonix 40 mg twice daily she reports excellent compliance with treatment. She is not  Having any  side effects from medication,  Patient  is not experiencing any signs and symptoms of reflux .    Patient is a 44 year old female, she reports to clinic today with concerns about bleeding and bruising which started about 3 months ago.  Patient reports she bruises on anything that her skin comes in contact with.  Patient is not taking any medication that would cause her to bleed.  Patient has no family history significant for bleeding disorders.  Prior to this patient reports having heavy menstrual cycle which she has since undergone uterine ablation procedure August 2020.  Patient is concerned about current changes in skin.   Concerning patient's paresthesia, patient reports new tingling bilateral upper extremities and bilateral lower extremities with fatigue.  This is new for patient. Patient has a history of Raynaud's disease not significant for current problem.  Nothing seems to worsen or alleviate symptoms.  -----------------------------------------------------------------------------------------  Past Medical History:  Diagnosis Date  . GERD (gastroesophageal reflux disease)     Past Surgical History:  Procedure Laterality Date  . BREAST SURGERY    . LASIK      Family History  Problem Relation Age of Onset  . Hypertension Mother   . Cancer Father        Prostate  . Hypertension Father     Social History   Socioeconomic History  . Marital status: Married    Spouse name: Not on file  . Number of children: Not on file  . Years of education: Not on  file  . Highest education level: Not on file  Occupational History  . Not on file  Tobacco Use  . Smoking status: Never Smoker  . Smokeless tobacco: Never Used  Vaping Use  . Vaping Use: Never used  Substance and Sexual Activity  . Alcohol use: No  . Drug use: No  . Sexual activity: Not on file  Other Topics Concern  . Not on file  Social History Narrative  . Not on file   Social Determinants of Health   Financial Resource Strain:   . Difficulty of Paying Living Expenses:   Food Insecurity:   . Worried About Programme researcher, broadcasting/film/video in the Last Year:   . Barista in the Last Year:   Transportation Needs:   . Freight forwarder (Medical):   Marland Kitchen Lack of Transportation (Non-Medical):   Physical Activity:   . Days of Exercise per Week:   . Minutes of Exercise per Session:   Stress:   . Feeling of Stress :   Social Connections:   . Frequency of Communication with Friends and Family:   . Frequency of Social Gatherings with Friends and Family:   . Attends Religious Services:   . Active Member of Clubs or Organizations:   . Attends Banker Meetings:   Marland Kitchen Marital Status:   Intimate Partner Violence:   . Fear of Current or Ex-Partner:   . Emotionally Abused:   Marland Kitchen Physically Abused:   . Sexually Abused:  Outpatient Medications Prior to Visit  Medication Sig Dispense Refill  . NIFEdipine (PROCARDIA) 10 MG capsule Take 1 capsule (10 mg total) by mouth 3 (three) times daily. 90 capsule 6  . pantoprazole (PROTONIX) 40 MG tablet TAKE 1 TABLET BY MOUTH TWICE A DAY 180 tablet 0  . phentermine (ADIPEX-P) 37.5 MG tablet Take 37.5 mg by mouth every morning.     No facility-administered medications prior to visit.    No Known Allergies  Review of Systems  Constitutional: Negative.   HENT: Negative.   Eyes: Negative.   Respiratory: Negative.   Gastrointestinal: Negative.   Genitourinary: Negative.   Musculoskeletal: Negative.   Skin: Positive for color  change.       Ecchymosis, Easy bruising  Neurological: Negative for light-headedness and headaches.  Psychiatric/Behavioral: The patient is not nervous/anxious.        Objective:    Physical Exam Constitutional:      Appearance: Normal appearance.  HENT:     Head: Normocephalic.  Cardiovascular:     Rate and Rhythm: Normal rate and regular rhythm.     Pulses: Normal pulses.     Heart sounds: Normal heart sounds.  Pulmonary:     Effort: Pulmonary effort is normal.     Breath sounds: Normal breath sounds.  Abdominal:     General: Bowel sounds are normal.  Musculoskeletal:     Cervical back: Neck supple.  Skin:    General: Skin is warm.     Findings: Bruising and erythema present.  Neurological:     Mental Status: She is alert and oriented to person, place, and time.  Psychiatric:        Mood and Affect: Mood normal.        Behavior: Behavior normal.     BP 116/68   Pulse 79   Temp (!) 97.4 F (36.3 C) (Temporal)   Ht 5\' 5"  (1.651 m)   Wt 207 lb (93.9 kg)   SpO2 98%   BMI 34.45 kg/m  Wt Readings from Last 3 Encounters:  10/29/19 214 lb (97.1 kg)  10/16/19 213 lb (96.6 kg)  06/27/19 209 lb (94.8 kg)    Health Maintenance Due  Topic Date Due  . Hepatitis C Screening  Never done  . COVID-19 Vaccine (1) Never done  . HIV Screening  Never done  . PAP SMEAR-Modifier  02/20/2017    There are no preventive care reminders to display for this patient.   Lab Results  Component Value Date   TSH 0.955 10/16/2019   Lab Results  Component Value Date   WBC 5.9 10/16/2019   HGB 14.1 10/16/2019   HCT 39.8 10/16/2019   MCV 94 10/16/2019   PLT 188 10/16/2019   Lab Results  Component Value Date   NA 141 10/16/2019   K 3.8 10/16/2019   CO2 22 10/16/2019   GLUCOSE 78 10/16/2019   BUN 11 10/16/2019   CREATININE 0.95 10/16/2019   BILITOT 0.6 10/16/2019   ALKPHOS 67 10/16/2019   AST 15 10/16/2019   ALT 9 10/16/2019   PROT 6.6 10/16/2019   ALBUMIN 4.1  10/16/2019   CALCIUM 9.1 10/16/2019   Lab Results  Component Value Date   CHOL 102 09/12/2015   Lab Results  Component Value Date   HDL 41 09/12/2015   Lab Results  Component Value Date   LDLCALC 51 09/12/2015   Lab Results  Component Value Date   TRIG 52 09/12/2015   Lab Results  Component  Value Date   CHOLHDL 2.5 09/12/2015      Assessment & Plan:  Easy bruising Patient is a 44 year old female she reports to clinic today with concerns about bleeding and bruising which started about 3 months ago.  Bruising is not well controlled.  Patient reports she bruises on anything that comes in contact with her skin.  Patient is not taking any medication that will cause her to bleed.  Patient has no family history significant for bleeding disorders.  Prior to this patient reports heavy bleeding which she has since undergone uterine ablation procedure August 2020.  Patient is concerned about current changes to skin.  Provided education to patient. Labs ordered-PT/PTT, CBC, B12, folate, TSH. Results pending Patient knows to follow-up with worsening or unresolved symptoms.  Paresthesia Paresthesia bilateral upper and lower extremities not well controlled.  This is new for patient in the last 3 months.TSH,  B12 and folate labs collected. Results pending.  Problem List Items Addressed This Visit      Other   Paresthesia - Primary   Relevant Orders   Vitamin B12   Folate   TSH    Other Visit Diagnoses    Thinning of skin       Easy bruising       Relevant Orders   CBC with Differential   PT AND PTT          Daryll Drown, NP

## 2020-02-01 LAB — CBC WITH DIFFERENTIAL/PLATELET
Basophils Absolute: 0 10*3/uL (ref 0.0–0.2)
Basos: 1 %
EOS (ABSOLUTE): 0.1 10*3/uL (ref 0.0–0.4)
Eos: 1 %
Hematocrit: 38.6 % (ref 34.0–46.6)
Hemoglobin: 13.1 g/dL (ref 11.1–15.9)
Immature Grans (Abs): 0 10*3/uL (ref 0.0–0.1)
Immature Granulocytes: 0 %
Lymphocytes Absolute: 2.4 10*3/uL (ref 0.7–3.1)
Lymphs: 40 %
MCH: 32 pg (ref 26.6–33.0)
MCHC: 33.9 g/dL (ref 31.5–35.7)
MCV: 94 fL (ref 79–97)
Monocytes Absolute: 0.4 10*3/uL (ref 0.1–0.9)
Monocytes: 7 %
Neutrophils Absolute: 3.1 10*3/uL (ref 1.4–7.0)
Neutrophils: 51 %
Platelets: 194 10*3/uL (ref 150–450)
RBC: 4.09 x10E6/uL (ref 3.77–5.28)
RDW: 11.8 % (ref 11.7–15.4)
WBC: 6 10*3/uL (ref 3.4–10.8)

## 2020-02-01 LAB — PT AND PTT
INR: 1 (ref 0.9–1.2)
Prothrombin Time: 10.4 s (ref 9.1–12.0)
aPTT: 26 s (ref 24–33)

## 2020-02-01 LAB — VITAMIN B12: Vitamin B-12: 265 pg/mL (ref 232–1245)

## 2020-02-01 LAB — TSH: TSH: 1.48 u[IU]/mL (ref 0.450–4.500)

## 2020-02-01 LAB — FOLATE: Folate: 8.5 ng/mL (ref >3.0)

## 2020-02-07 ENCOUNTER — Ambulatory Visit (HOSPITAL_COMMUNITY): Admission: RE | Admit: 2020-02-07 | Payer: BC Managed Care – PPO | Source: Ambulatory Visit

## 2020-02-07 ENCOUNTER — Ambulatory Visit: Payer: BC Managed Care – PPO | Admitting: Vascular Surgery

## 2020-02-14 ENCOUNTER — Encounter: Payer: Self-pay | Admitting: Surgery

## 2020-02-21 LAB — HM PAP SMEAR: HPV 16/18/45 genotyping: NEGATIVE

## 2020-03-28 ENCOUNTER — Encounter: Payer: Self-pay | Admitting: Nurse Practitioner

## 2020-03-28 ENCOUNTER — Ambulatory Visit (INDEPENDENT_AMBULATORY_CARE_PROVIDER_SITE_OTHER): Payer: BC Managed Care – PPO | Admitting: Nurse Practitioner

## 2020-03-28 DIAGNOSIS — B3731 Acute candidiasis of vulva and vagina: Secondary | ICD-10-CM

## 2020-03-28 DIAGNOSIS — B373 Candidiasis of vulva and vagina: Secondary | ICD-10-CM

## 2020-03-28 MED ORDER — FLUCONAZOLE 150 MG PO TABS: ORAL_TABLET | ORAL | 0 refills | Status: DC

## 2020-03-28 NOTE — Progress Notes (Signed)
   Virtual Visit via telephone Note Due to COVID-19 pandemic this visit was conducted virtually. This visit type was conducted due to national recommendations for restrictions regarding the COVID-19 Pandemic (e.g. social distancing, sheltering in place) in an effort to limit this patient's exposure and mitigate transmission in our community. All issues noted in this document were discussed and addressed.  A physical exam was not performed with this format.  I connected with Sharon Foster on 03/28/20 at 10:00 by telephone and verified that I am speaking with the correct person using two identifiers. Sharon Foster is currently located at home and no one is currently with her during visit. The provider, Mary-Margaret Daphine Deutscher, FNP is located in their office at time of visit.  I discussed the limitations, risks, security and privacy concerns of performing an evaluation and management service by telephone and the availability of in person appointments. I also discussed with the patient that there may be a patient responsible charge related to this service. The patient expressed understanding and agreed to proceed.   History and Present Illness:   Chief Complaint: Vaginitis   HPI Patient has been getting yeast infections every month after having endometrial ablation. She has not had one since June which was the best she has been in awhile. Diflucan really works well for her. She usually has to take 2 doses before it will clear up.          Review of Systems  Constitutional: Negative.   Respiratory: Negative.   Cardiovascular: Negative.   Genitourinary: Negative.   Neurological: Negative.   Psychiatric/Behavioral: Negative.   All other systems reviewed and are negative.    Observations/Objective: Alert and oriented- answers all questions appropriately No distress    Assessment and Plan: Sharon Foster in today with chief complaint of Vaginitis   1. Vaginal  candidiasis Void after intercourse No bubble baths - fluconazole (DIFLUCAN) 150 MG tablet; 1 po q week x 4 weeks  Dispense: 4 tablet; Refill: 0    Follow Up Instructions: prn    I discussed the assessment and treatment plan with the patient. The patient was provided an opportunity to ask questions and all were answered. The patient agreed with the plan and demonstrated an understanding of the instructions.   The patient was advised to call back or seek an in-person evaluation if the symptoms worsen or if the condition fails to improve as anticipated.  The above assessment and management plan was discussed with the patient. The patient verbalized understanding of and has agreed to the management plan. Patient is aware to call the clinic if symptoms persist or worsen. Patient is aware when to return to the clinic for a follow-up visit. Patient educated on when it is appropriate to go to the emergency department.   Time call ended:  10:15  I provided 15 minutes of non-face-to-face time during this encounter.    Mary-Margaret Daphine Deutscher, FNP

## 2020-05-23 ENCOUNTER — Telehealth: Payer: Self-pay

## 2020-05-23 MED ORDER — FLUCONAZOLE 150 MG PO TABS: ORAL_TABLET | ORAL | 0 refills | Status: DC

## 2020-05-23 NOTE — Telephone Encounter (Signed)
Diflucan refilled 

## 2020-05-23 NOTE — Telephone Encounter (Signed)
Patient aware that prescription has been sent in 

## 2020-05-23 NOTE — Telephone Encounter (Signed)
  Incoming Patient Call  05/23/2020  What symptoms do you have? Yeast inf  How long have you been sick? For about 4 days  Have you been seen for this problem? Yes in august televisit with mmm was told to call her if she needed more diflucan   If your provider decides to give you a prescription, which pharmacy would you like for it to be sent to? [facility]    Patient informed that this information will be sent to the clinical staff for review and that they should receive a follow up call.

## 2020-06-17 ENCOUNTER — Telehealth: Payer: Self-pay

## 2020-06-17 MED ORDER — PANTOPRAZOLE SODIUM 40 MG PO TBEC
40.0000 mg | DELAYED_RELEASE_TABLET | Freq: Two times a day (BID) | ORAL | 0 refills | Status: DC
Start: 2020-06-17 — End: 2020-07-21

## 2020-06-17 NOTE — Telephone Encounter (Signed)
  Prescription Request  06/17/2020  What is the name of the medication or equipment? pantoprazole (PROTONIX) 40 MG tablet    Have you contacted your pharmacy to request a refill? (if applicable) YES  Which pharmacy would you like this sent to? [facility], pt has appt with Dr. Darlyn Read on 07/21/20 she is about to run out    Patient notified that their request is being sent to the clinical staff for review and that they should receive a response within 2 business days.

## 2020-06-17 NOTE — Telephone Encounter (Signed)
sent 

## 2020-07-21 ENCOUNTER — Other Ambulatory Visit: Payer: Self-pay

## 2020-07-21 ENCOUNTER — Encounter: Payer: Self-pay | Admitting: Family Medicine

## 2020-07-21 ENCOUNTER — Ambulatory Visit: Payer: BC Managed Care – PPO | Admitting: Family Medicine

## 2020-07-21 VITALS — BP 122/70 | HR 74 | Temp 98.2°F | Ht 65.0 in | Wt 211.2 lb

## 2020-07-21 DIAGNOSIS — K219 Gastro-esophageal reflux disease without esophagitis: Secondary | ICD-10-CM

## 2020-07-21 DIAGNOSIS — B373 Candidiasis of vulva and vagina: Secondary | ICD-10-CM | POA: Diagnosis not present

## 2020-07-21 DIAGNOSIS — E669 Obesity, unspecified: Secondary | ICD-10-CM | POA: Diagnosis not present

## 2020-07-21 DIAGNOSIS — B3731 Acute candidiasis of vulva and vagina: Secondary | ICD-10-CM

## 2020-07-21 MED ORDER — PANTOPRAZOLE SODIUM 40 MG PO TBEC
40.0000 mg | DELAYED_RELEASE_TABLET | Freq: Two times a day (BID) | ORAL | 3 refills | Status: DC
Start: 2020-07-21 — End: 2021-04-28

## 2020-07-21 MED ORDER — FLUCONAZOLE 150 MG PO TABS: ORAL_TABLET | ORAL | 0 refills | Status: DC

## 2020-07-21 NOTE — Progress Notes (Signed)
Subjective:  Patient ID: Alwyn Ren, female    DOB: 05-21-1976  Age: 44 y.o. MRN: [id number]  CC: Medical Management of Chronic Issues   HPI Seriyah Collison Fishman presents for patient in for follow-up of GERD. Currently asymptomatic taking  PPI daily. There is no chest pain or heartburn. No hematemesis and no melena. No dysphagia or choking. Onset is remote. Progression is stable. Complicating factors, none. Patient also having some recurrent episodes of yeast.  She has been told that she should take a Diflucan pill once a month to avoid further infections.  She recently had a uterine ablation and a tubal ligation and during that time her Mirena was removed as well.  She has not had any further bleeding but she is prone to these infections since those procedures were performed.  We discussed weight loss.  Patient agrees that the phentermine has become ineffective for her over time.  She is willing to consider Ozempic but does not want to commit to that at this time.  Depression screen [facility] 2/9 07/21/2020 01/31/2020 10/16/2019  Decreased Interest 0 0 0  Down, Depressed, Hopeless 0 0 0  PHQ - 2 Score 0 0 0  Altered sleeping - 0 -  Tired, decreased energy - 0 -  Change in appetite - 0 -  Feeling bad or failure about yourself  - 0 -  Trouble concentrating - 0 -  Moving slowly or fidgety/restless - 0 -  Suicidal thoughts - 0 -  PHQ-9 Score - 0 -  Difficult doing work/chores - - -  Some recent data might be hidden    History Vasilia has a past medical history of GERD (gastroesophageal reflux disease).   She has a past surgical history that includes Breast surgery and LASIK.   Her family history includes Cancer in her father; Hypertension in her father and mother.She reports that she has never smoked. She has never used smokeless tobacco. She reports that she does not drink alcohol and does not use drugs.    ROS Review of Systems  Constitutional: Negative.   HENT: Negative.     Eyes: Negative for visual disturbance.  Respiratory: Negative for shortness of breath.   Cardiovascular: Negative for chest pain.  Gastrointestinal: Negative for abdominal pain.  Musculoskeletal: Negative for arthralgias.    Objective:  BP 122/70   Pulse 74   Temp 98.2 F (36.8 C) (Temporal)   Ht '5\' 5"'  (1.651 m)   Wt 211 lb 4 oz (95.8 kg)   BMI 35.15 kg/m   BP Readings from Last 3 Encounters:  07/21/20 122/70  01/31/20 116/68  10/29/19 112/64    Wt Readings from Last 3 Encounters:  07/21/20 211 lb 4 oz (95.8 kg)  01/31/20 207 lb (93.9 kg)  10/29/19 214 lb (97.1 kg)     Physical Exam Constitutional:      General: She is not in acute distress.    Appearance: She is well-developed.  HENT:     Head: Normocephalic and atraumatic.  Eyes:     Conjunctiva/sclera: Conjunctivae normal.     Pupils: Pupils are equal, round, and reactive to light.  Neck:     Thyroid: No thyromegaly.  Cardiovascular:     Rate and Rhythm: Normal rate and regular rhythm.     Heart sounds: Normal heart sounds. No murmur heard.   Pulmonary:     Effort: Pulmonary effort is normal. No respiratory distress.     Breath sounds: Normal breath sounds. No wheezing or  rales.  Abdominal:     General: Bowel sounds are normal. There is no distension.     Palpations: Abdomen is soft.     Tenderness: There is no abdominal tenderness.  Musculoskeletal:        General: Normal range of motion.     Cervical back: Normal range of motion and neck supple.  Lymphadenopathy:     Cervical: No cervical adenopathy.  Skin:    General: Skin is warm and dry.  Neurological:     Mental Status: She is alert and oriented to person, place, and time.  Psychiatric:        Behavior: Behavior normal.        Thought Content: Thought content normal.        Judgment: Judgment normal.       Assessment & Plan:   Sumayya was seen today for medical management of chronic issues.  Diagnoses and all orders for this  visit:  Gastroesophageal reflux disease, unspecified whether esophagitis present -     CBC with Differential/Platelet -     CMP14+EGFR  Yeast vaginitis  Obesity (BMI 35.0-39.9 without comorbidity)  Other orders -     fluconazole (DIFLUCAN) 150 MG tablet; 1 po monthly -     pantoprazole (PROTONIX) 40 MG tablet; Take 1 tablet (40 mg total) by mouth 2 (two) times daily.       I have discontinued Annie S. Simons's phentermine. I have also changed her fluconazole. Additionally, I am having her maintain her Polyethylene Glycol 3350 (MIRALAX PO) and pantoprazole.  Allergies as of 07/21/2020   No Known Allergies     Medication List       Accurate as of July 21, 2020  5:02 PM. If you have any questions, ask your nurse or doctor.        STOP taking these medications   phentermine 37.5 MG tablet Commonly known as: ADIPEX-P Stopped by: Claretta Fraise, MD     TAKE these medications   fluconazole 150 MG tablet Commonly known as: Diflucan 1 po monthly What changed: additional instructions Changed by: Claretta Fraise, MD   MIRALAX PO Take by mouth.   pantoprazole 40 MG tablet Commonly known as: PROTONIX Take 1 tablet (40 mg total) by mouth 2 (two) times daily.        Follow-up: Return in about 1 year (around 07/21/2021), or if symptoms worsen or fail to improve.  Claretta Fraise, M.D.

## 2020-07-22 LAB — CMP14+EGFR
ALT: 7 IU/L (ref 0–32)
AST: 13 IU/L (ref 0–40)
Albumin/Globulin Ratio: 1.3 (ref 1.2–2.2)
Albumin: 3.8 g/dL (ref 3.8–4.8)
Alkaline Phosphatase: 79 IU/L (ref 44–121)
BUN/Creatinine Ratio: 11 (ref 9–23)
BUN: 10 mg/dL (ref 6–24)
Bilirubin Total: 0.9 mg/dL (ref 0.0–1.2)
CO2: 24 mmol/L (ref 20–29)
Calcium: 9 mg/dL (ref 8.7–10.2)
Chloride: 105 mmol/L (ref 96–106)
Creatinine, Ser: 0.94 mg/dL (ref 0.57–1.00)
GFR calc Af Amer: 86 mL/min/{1.73_m2} (ref >59)
GFR calc non Af Amer: 75 mL/min/{1.73_m2} (ref >59)
Globulin, Total: 2.9 g/dL (ref 1.5–4.5)
Glucose: 87 mg/dL (ref 65–99)
Potassium: 3.7 mmol/L (ref 3.5–5.2)
Sodium: 140 mmol/L (ref 134–144)
Total Protein: 6.7 g/dL (ref 6.0–8.5)

## 2020-07-22 LAB — CBC WITH DIFFERENTIAL/PLATELET
Basophils Absolute: 0.1 10*3/uL (ref 0.0–0.2)
Basos: 1 %
EOS (ABSOLUTE): 0.1 10*3/uL (ref 0.0–0.4)
Eos: 1 %
Hematocrit: 40.8 % (ref 34.0–46.6)
Hemoglobin: 13.8 g/dL (ref 11.1–15.9)
Immature Grans (Abs): 0 10*3/uL (ref 0.0–0.1)
Immature Granulocytes: 0 %
Lymphocytes Absolute: 2.5 10*3/uL (ref 0.7–3.1)
Lymphs: 30 %
MCH: 32.1 pg (ref 26.6–33.0)
MCHC: 33.8 g/dL (ref 31.5–35.7)
MCV: 95 fL (ref 79–97)
Monocytes Absolute: 0.5 10*3/uL (ref 0.1–0.9)
Monocytes: 6 %
Neutrophils Absolute: 5.3 10*3/uL (ref 1.4–7.0)
Neutrophils: 62 %
Platelets: 209 10*3/uL (ref 150–450)
RBC: 4.3 x10E6/uL (ref 3.77–5.28)
RDW: 11.8 % (ref 11.7–15.4)
WBC: 8.4 10*3/uL (ref 3.4–10.8)

## 2020-07-22 NOTE — Progress Notes (Signed)
Hello Sharon Foster,  Your lab result is normal and/or stable.Some minor variations that are not significant are commonly marked abnormal, but do not represent any medical problem for you.  Best regards, Jasmeet Gehl, M.D.

## 2020-10-15 ENCOUNTER — Ambulatory Visit: Payer: BC Managed Care – PPO | Admitting: Family Medicine

## 2020-10-17 ENCOUNTER — Encounter: Payer: Self-pay | Admitting: Family Medicine

## 2020-10-17 ENCOUNTER — Telehealth: Payer: BC Managed Care – PPO | Admitting: Family Medicine

## 2020-10-17 DIAGNOSIS — J0110 Acute frontal sinusitis, unspecified: Secondary | ICD-10-CM

## 2020-10-17 MED ORDER — AMOXICILLIN-POT CLAVULANATE 875-125 MG PO TABS: 1.0000 | ORAL_TABLET | Freq: Two times a day (BID) | ORAL | 0 refills | Status: AC

## 2020-10-17 MED ORDER — BENZONATATE 100 MG PO CAPS: 100.0000 mg | ORAL_CAPSULE | Freq: Three times a day (TID) | ORAL | 0 refills | Status: DC | PRN

## 2020-10-17 MED ORDER — FLUCONAZOLE 150 MG PO TABS: ORAL_TABLET | ORAL | 0 refills | Status: DC

## 2020-10-17 MED ORDER — PREDNISONE 10 MG (21) PO TBPK: ORAL_TABLET | ORAL | 0 refills | Status: DC

## 2020-10-17 NOTE — Progress Notes (Signed)
Virtual Visit via telephone Note Due to COVID-19 pandemic this visit was conducted virtually. This visit type was conducted due to national recommendations for restrictions regarding the COVID-19 Pandemic (e.g. social distancing, sheltering in place) in an effort to limit this patient's exposure and mitigate transmission in our community. All issues noted in this document were discussed and addressed.  A physical exam was not performed with this format.  I connected with Sharon Foster on 10/17/20 at 0907 by telephone and verified that I am speaking with the correct person using two identifiers. Sharon Foster is currently located at home and her family is currently with her during the visit. The provider, Gabriel Earing, FNP is located in their office at time of visit.  I discussed the limitations, risks, security and privacy concerns of performing an evaluation and management service by telephone and the availability of in person appointments. I also discussed with the patient that there may be a patient responsible charge related to this service. The patient expressed understanding and agreed to proceed.  CC: cough, congestion  History and Present Illness:  HPI  Sharon Foster would prefer to do a telephone visit today. Sharon Foster reports head congestion, headache, and scratchy throat x 6 days. She also reports sore lymph nodes in her neck. Yesterday her headache worsened with pressure around her eyes and nasal bridge. She also developed a productive cough with green sputum yesterday. She feels a little tired. She denies fever, chills, body aches, chest pain, shortness of breath, wheezing, nausea, or vomiting. She was exposed to Covid the day her symptoms started. [facility] and had a negative home test yesterday. She has been taking Advil cold and sinus, cough drops, and nasal saline with little improvement. She has a history of frequent sinus infections  requiring antibiotics.  She also typically gets yeast infections after antibiotics.    ROS As per HPI.   Observations/Objective: Alert and oriented x 3. Able to speak in full sentences without difficulty.   Assessment and Plan: Sharon Foster was seen today for head congestion and cough.  Diagnoses and all orders for this visit:  Acute non-recurrent frontal sinusitis Symptoms x 6 days with worsening of symptoms yesterday. Will treat for bacterial sinusitis. Mucinex and nasal saline for congestion. Tessalon perles for cough. Diflucan sent in as patient frequently get yeast infections with antibiotic use. Rest, push fluids. Return to office for new or worsening symptoms, or if symptoms persist.  -     amoxicillin-clavulanate (AUGMENTIN) 875-125 MG tablet; Take 1 tablet by mouth 2 (two) times daily for 10 days. -     predniSONE (STERAPRED UNI-PAK 21 TAB) 10 MG (21) TBPK tablet; Use as directed on back of pill pack -     benzonatate (TESSALON PERLES) 100 MG capsule; Take 1 capsule (100 mg total) by mouth 3 (three) times daily as needed for cough. -     fluconazole (DIFLUCAN) 150 MG tablet; 1 po monthly  Follow Up Instructions: As needed.     I discussed the assessment and treatment plan with the patient. The patient was provided an opportunity to ask questions and all were answered. The patient agreed with the plan and demonstrated an understanding of the instructions.   The patient was advised to call back or seek an in-person evaluation if the symptoms worsen or if the condition fails to improve as anticipated.  The above assessment and management plan was discussed with the patient. The  patient verbalized understanding of and has agreed to the management plan. Patient is aware to call the clinic if symptoms persist or worsen. Patient is aware when to return to the clinic for a follow-up visit. Patient educated on when it is appropriate to go to the emergency department.   Time call ended:   0915  I provided 8 minutes of phone time during this encounter.   Gabriel Earing, FNP

## 2021-01-01 ENCOUNTER — Encounter: Payer: Self-pay | Admitting: Family Medicine

## 2021-01-01 ENCOUNTER — Telehealth: Payer: BC Managed Care – PPO | Admitting: Family Medicine

## 2021-01-01 DIAGNOSIS — J40 Bronchitis, not specified as acute or chronic: Secondary | ICD-10-CM

## 2021-01-01 DIAGNOSIS — J329 Chronic sinusitis, unspecified: Secondary | ICD-10-CM

## 2021-01-01 MED ORDER — AZITHROMYCIN 250 MG PO TABS: ORAL_TABLET | ORAL | 0 refills | Status: DC

## 2021-01-01 MED ORDER — DEXAMETHASONE 6 MG PO TABS: 6.0000 mg | ORAL_TABLET | Freq: Two times a day (BID) | ORAL | 0 refills | Status: DC

## 2021-01-01 NOTE — Progress Notes (Signed)
Subjective:    Patient ID: Sharon Foster, female    DOB: Apr 24, 1976, 45 y.o.   MRN: [id number]   HPI: Sharon Foster is a 45 y.o. female presenting for onset on 5/8 at noon. Sinus at first. Now all in the chest. Home COVID was negative on 5/8 & 5/12. Chest felt tight. Had ST first 2 days. Now feels tightness inc chest when she tries to lay down. Can't rest. Cough with green sputum. No fever. .O2 staying at 98%   Depression screen [facility] 2/9 07/21/2020 01/31/2020 10/16/2019 06/27/2019 02/16/2019  Decreased Interest 0 0 0 0 0  Down, Depressed, Hopeless 0 0 0 0 0  PHQ - 2 Score 0 0 0 0 0  Altered sleeping - 0 - - -  Tired, decreased energy - 0 - - -  Change in appetite - 0 - - -  Feeling bad or failure about yourself  - 0 - - -  Trouble concentrating - 0 - - -  Moving slowly or fidgety/restless - 0 - - -  Suicidal thoughts - 0 - - -  PHQ-9 Score - 0 - - -  Difficult doing work/chores - - - - -  Some recent data might be hidden     Relevant past medical, surgical, family and social history reviewed and updated as indicated.  Interim medical history since our last visit reviewed. Allergies and medications reviewed and updated.  ROS:  Review of Systems  Constitutional: Negative for activity change, appetite change, chills and fever.  HENT: Positive for congestion and sinus pressure (only the first day). Negative for ear discharge, ear pain, hearing loss, nosebleeds, postnasal drip, rhinorrhea, sneezing and trouble swallowing.   Respiratory: Positive for cough and chest tightness. Negative for shortness of breath.   Cardiovascular: Negative for chest pain and palpitations.  Skin: Negative for rash.     Social History   Tobacco Use  Smoking Status Never Smoker  Smokeless Tobacco Never Used       Objective:     Wt Readings from Last 3 Encounters:  07/21/20 211 lb 4 oz (95.8 kg)  01/31/20 207 lb (93.9 kg)  10/29/19 214 lb (97.1 kg)     Exam deferred. Pt. Harboring  due to COVID 19. Phone visit performed.   Assessment & Plan:   1. Sinobronchitis     Meds ordered this encounter  Medications  . azithromycin (ZITHROMAX Z-PAK) 250 MG tablet    Sig: Take two right away Then one a day for the next 4 days.    Dispense:  6 each    Refill:  0  . dexamethasone (DECADRON) 6 MG tablet    Sig: Take 1 tablet (6 mg total) by mouth 2 (two) times daily with a meal.    Dispense:  10 tablet    Refill:  0    Orders Placed This Encounter  Procedures  . SARS-CoV-2 Semi-Quantitative Total Antibody, Spike    Order Specific Question:   Is this test for diagnosis or screening    Answer:   Diagnosis of ill patient    Order Specific Question:   Symptomatic for COVID-19 as defined by CDC    Answer:   Yes    Order Specific Question:   Date of Symptom Onset    Answer:   12/28/2020    Order Specific Question:   Hospitalized for COVID-19    Answer:   No    Order Specific Question:   Admitted to ICU  for COVID-19    Answer:   No    Order Specific Question:   Previously tested for COVID-19    Answer:   Yes    Order Specific Question:   Resident in a congregate (group) care setting    Answer:   No    Order Specific Question:   Employed in healthcare setting    Answer:   Yes    Order Specific Question:   Pregnant    Answer:   No    Order Specific Question:   Release to patient    Answer:   Immediate      Diagnoses and all orders for this visit:  Sinobronchitis -     SARS-CoV-2 Semi-Quantitative Total Antibody, Spike  Other orders -     azithromycin (ZITHROMAX Z-PAK) 250 MG tablet; Take two right away Then one a day for the next 4 days. -     dexamethasone (DECADRON) 6 MG tablet; Take 1 tablet (6 mg total) by mouth 2 (two) times daily with a meal.    Virtual Visit via telephone Note  I discussed the limitations, risks, security and privacy concerns of performing an evaluation and management service by telephone and the availability of in person appointments.  The patient was identified with two identifiers. Pt.expressed understanding and agreed to proceed. Pt. Is at home. Dr. Darlyn Read is in his office.  Follow Up Instructions:   I discussed the assessment and treatment plan with the patient. The patient was provided an opportunity to ask questions and all were answered. The patient agreed with the plan and demonstrated an understanding of the instructions.   The patient was advised to call back or seek an in-person evaluation if the symptoms worsen or if the condition fails to improve as anticipated.   Total minutes including chart review and phone contact time: 13   Follow up plan: Return if symptoms worsen or fail to improve.  Mechele Claude, MD [facility]

## 2021-03-24 ENCOUNTER — Encounter: Payer: Self-pay | Admitting: Family Medicine

## 2021-03-24 ENCOUNTER — Ambulatory Visit: Payer: BC Managed Care – PPO | Admitting: Family Medicine

## 2021-03-24 ENCOUNTER — Other Ambulatory Visit: Payer: Self-pay

## 2021-03-24 VITALS — BP 110/72 | HR 88 | Temp 98.2°F | Ht 65.0 in | Wt 208.4 lb

## 2021-03-24 DIAGNOSIS — R300 Dysuria: Secondary | ICD-10-CM | POA: Diagnosis not present

## 2021-03-24 DIAGNOSIS — B9689 Other specified bacterial agents as the cause of diseases classified elsewhere: Secondary | ICD-10-CM

## 2021-03-24 DIAGNOSIS — Z6841 Body Mass Index (BMI) 40.0 and over, adult: Secondary | ICD-10-CM

## 2021-03-24 DIAGNOSIS — N760 Acute vaginitis: Secondary | ICD-10-CM

## 2021-03-24 LAB — URINALYSIS, COMPLETE
Bilirubin, UA: NEGATIVE
Glucose, UA: NEGATIVE
Ketones, UA: NEGATIVE
Leukocytes,UA: NEGATIVE
Nitrite, UA: NEGATIVE
Protein,UA: NEGATIVE
RBC, UA: NEGATIVE
Specific Gravity, UA: 1.025 (ref 1.005–1.030)
Urobilinogen, Ur: 0.2 mg/dL (ref 0.2–1.0)
pH, UA: 5.5 (ref 5.0–7.5)

## 2021-03-24 LAB — MICROSCOPIC EXAMINATION: RBC, Urine: NONE SEEN /hpf (ref 0–2)

## 2021-03-24 MED ORDER — METRONIDAZOLE 500 MG PO TABS: 500.0000 mg | ORAL_TABLET | Freq: Two times a day (BID) | ORAL | 0 refills | Status: DC

## 2021-03-24 MED ORDER — PHENTERMINE HCL 37.5 MG PO CAPS: 37.5000 mg | ORAL_CAPSULE | ORAL | 2 refills | Status: DC

## 2021-03-24 NOTE — Progress Notes (Signed)
Subjective:  Patient ID: Sharon Foster, female    DOB: Jun 15, 1976  Age: 45 y.o. MRN: [id number]  CC: Dysuria   HPI Sharon Foster presents for burning with urination and frequency for a month. Denies fever . No flank pain. No nausea, vomiting. Burning is constant up in the urethra. DC is scant, clear. DC has an odor.  Weight is back up doing diet wih intermittent fasting. Needs help getting started. Has used phentermine to get started in the past.   Depression screen [facility] 2/9 03/24/2021 07/21/2020 01/31/2020  Decreased Interest 0 0 0  Down, Depressed, Hopeless 0 0 0  PHQ - 2 Score 0 0 0  Altered sleeping - - 0  Tired, decreased energy - - 0  Change in appetite - - 0  Feeling bad or failure about yourself  - - 0  Trouble concentrating - - 0  Moving slowly or fidgety/restless - - 0  Suicidal thoughts - - 0  PHQ-9 Score - - 0  Difficult doing work/chores - - -  Some recent data might be hidden    History Sharon Foster has a past medical history of GERD (gastroesophageal reflux disease).   She has a past surgical history that includes Breast surgery and LASIK.   Her family history includes Cancer in her father; Hypertension in her father and mother.She reports that she has never smoked. She has never used smokeless tobacco. She reports that she does not drink alcohol and does not use drugs.    ROS Review of Systems  Constitutional: Negative.   HENT: Negative.    Eyes:  Negative for visual disturbance.  Respiratory:  Negative for shortness of breath.   Cardiovascular:  Negative for chest pain.  Gastrointestinal:  Negative for abdominal pain.  Genitourinary:  Positive for difficulty urinating, dyspareunia, dysuria, pelvic pain and vaginal discharge. Negative for flank pain, frequency, urgency and vaginal bleeding.  Musculoskeletal:  Negative for arthralgias.   Objective:  BP 110/72   Pulse 88   Temp 98.2 F (36.8 C)   Ht 5\' 5"  (1.651 m)   Wt 208 lb 6.4 oz (94.5 kg)    SpO2 95%   BMI 34.68 kg/m   BP Readings from Last 3 Encounters:  03/24/21 110/72  07/21/20 122/70  01/31/20 116/68    Wt Readings from Last 3 Encounters:  03/24/21 208 lb 6.4 oz (94.5 kg)  07/21/20 211 lb 4 oz (95.8 kg)  01/31/20 207 lb (93.9 kg)     Physical Exam Constitutional:      General: She is not in acute distress.    Appearance: She is well-developed.  Cardiovascular:     Rate and Rhythm: Normal rate and regular rhythm.  Pulmonary:     Breath sounds: Normal breath sounds.  Musculoskeletal:        General: Normal range of motion.  Skin:    General: Skin is warm and dry.  Neurological:     Mental Status: She is alert and oriented to person, place, and time.      Assessment & Plan:   Sharon Foster was seen today for dysuria.  Diagnoses and all orders for this visit:  BV (bacterial vaginosis) -     metroNIDAZOLE (FLAGYL) 500 MG tablet; Take 1 tablet (500 mg total) by mouth 2 (two) times daily.  Dysuria -     Urinalysis, Complete -     Microscopic Examination -     Urine Culture  Class 3 severe obesity due to excess calories without  serious comorbidity with body mass index (BMI) of 40.0 to 44.9 in adult [facility]) -     phentermine 37.5 MG capsule; Take 1 capsule (37.5 mg total) by mouth every morning.      I am having Sharon Foster start on metroNIDAZOLE and phentermine. I am also having her maintain her Polyethylene Glycol 3350 (MIRALAX PO), pantoprazole, benzonatate, fluconazole, azithromycin, and dexamethasone.  Allergies as of 03/24/2021   No Known Allergies      Medication List        Accurate as of March 24, 2021  5:05 PM. If you have any questions, ask your nurse or doctor.          azithromycin 250 MG tablet Commonly known as: Zithromax Z-Pak Take two right away Then one a day for the next 4 days.   benzonatate 100 MG capsule Commonly known as: Tessalon Perles Take 1 capsule (100 mg total) by mouth 3 (three) times daily as needed for  cough.   dexamethasone 6 MG tablet Commonly known as: DECADRON Take 1 tablet (6 mg total) by mouth 2 (two) times daily with a meal.   fluconazole 150 MG tablet Commonly known as: Diflucan 1 po monthly   metroNIDAZOLE 500 MG tablet Commonly known as: FLAGYL Take 1 tablet (500 mg total) by mouth 2 (two) times daily. Started by: Mechele Claude, MD   MIRALAX PO Take by mouth.   pantoprazole 40 MG tablet Commonly known as: PROTONIX Take 1 tablet (40 mg total) by mouth 2 (two) times daily.   phentermine 37.5 MG capsule Take 1 capsule (37.5 mg total) by mouth every morning. Started by: Mechele Claude, MD         Follow-up: Return in about 3 months (around 06/24/2021), or if symptoms worsen or fail to improve, for weight.  Mechele Claude, M.D.

## 2021-03-25 ENCOUNTER — Telehealth: Payer: Self-pay | Admitting: Family Medicine

## 2021-03-26 ENCOUNTER — Other Ambulatory Visit: Payer: Self-pay | Admitting: Family Medicine

## 2021-03-26 MED ORDER — PHENTERMINE HCL 37.5 MG PO TABS: 37.5000 mg | ORAL_TABLET | Freq: Every day | ORAL | 0 refills | Status: DC

## 2021-03-26 NOTE — Telephone Encounter (Signed)
Please let the patient know that I sent their prescription to their pharmacy. Thanks, WS 

## 2021-03-27 ENCOUNTER — Telehealth: Payer: Self-pay | Admitting: Family Medicine

## 2021-03-27 ENCOUNTER — Encounter: Payer: Self-pay | Admitting: Family Medicine

## 2021-03-27 ENCOUNTER — Other Ambulatory Visit: Payer: Self-pay | Admitting: Family Medicine

## 2021-03-27 LAB — URINE CULTURE

## 2021-03-27 MED ORDER — NITROFURANTOIN MONOHYD MACRO 100 MG PO CAPS: 100.0000 mg | ORAL_CAPSULE | Freq: Two times a day (BID) | ORAL | 0 refills | Status: DC

## 2021-03-27 NOTE — Telephone Encounter (Signed)
Please review and advise.

## 2021-03-27 NOTE — Telephone Encounter (Signed)
Please let the patient know that I sent their prescription to their pharmacy. Thanks, WS 

## 2021-03-30 NOTE — Telephone Encounter (Signed)
LMTCB

## 2021-03-31 NOTE — Telephone Encounter (Signed)
Patient aware, states she has developed thrush and yeast infection. Requesting magic mouthwash and diflucan

## 2021-04-01 ENCOUNTER — Other Ambulatory Visit: Payer: Self-pay | Admitting: Family Medicine

## 2021-04-01 DIAGNOSIS — J0110 Acute frontal sinusitis, unspecified: Secondary | ICD-10-CM

## 2021-04-01 MED ORDER — NYSTATIN 100000 UNIT/ML MT SUSP: 5.0000 mL | Freq: Four times a day (QID) | OROMUCOSAL | 2 refills | Status: DC | PRN

## 2021-04-01 MED ORDER — FLUCONAZOLE 150 MG PO TABS
150.0000 mg | ORAL_TABLET | Freq: Once | ORAL | 0 refills | Status: AC
Start: 2021-04-01 — End: 2021-04-01

## 2021-04-01 NOTE — Telephone Encounter (Signed)
Please let the patient know that I sent their prescription to their pharmacy. Thanks, WS 

## 2021-04-25 ENCOUNTER — Other Ambulatory Visit: Payer: Self-pay | Admitting: Family Medicine

## 2021-05-07 ENCOUNTER — Encounter: Payer: Self-pay | Admitting: Family Medicine

## 2021-05-07 ENCOUNTER — Ambulatory Visit (INDEPENDENT_AMBULATORY_CARE_PROVIDER_SITE_OTHER): Payer: BC Managed Care – PPO | Admitting: Family Medicine

## 2021-05-07 DIAGNOSIS — J069 Acute upper respiratory infection, unspecified: Secondary | ICD-10-CM | POA: Diagnosis not present

## 2021-05-07 DIAGNOSIS — Z8619 Personal history of other infectious and parasitic diseases: Secondary | ICD-10-CM | POA: Diagnosis not present

## 2021-05-07 MED ORDER — AMOXICILLIN-POT CLAVULANATE 875-125 MG PO TABS: 1.0000 | ORAL_TABLET | Freq: Two times a day (BID) | ORAL | 0 refills | Status: AC

## 2021-05-07 MED ORDER — FLUCONAZOLE 150 MG PO TABS: 150.0000 mg | ORAL_TABLET | Freq: Once | ORAL | 0 refills | Status: AC

## 2021-05-07 NOTE — Progress Notes (Signed)
Virtual Visit via telephone Note Due to COVID-19 pandemic this visit was conducted virtually. This visit type was conducted due to national recommendations for restrictions regarding the COVID-19 Pandemic (e.g. social distancing, sheltering in place) in an effort to limit this patient's exposure and mitigate transmission in our community. All issues noted in this document were discussed and addressed.  A physical exam was not performed with this format.   I connected with Sharon Foster on 05/07/2021 at 1415 by telephone and verified that I am speaking with the correct person using two identifiers. [name] is currently located at home and family is currently with them during visit. The provider, Kari Baars, FNP is located in their office at time of visit.  I discussed the limitations, risks, security and privacy concerns of performing an evaluation and management service by telephone and the availability of in person appointments. I also discussed with the patient that there may be a patient responsible charge related to this service. The patient expressed understanding and agreed to proceed.  Subjective:  Patient ID: Sharon Foster, female    DOB: [birth year], 45 y.o.   MRN: [id number]  Chief Complaint:  URI   HPI: Sharon Foster is a 45 y.o. female presenting on 05/07/2021 for URI   Pt reports ongoing cough, congestion, chest congestion, and sore throat for over 8 days. She has been taking sinus and cold medication over the counter without relief of symptoms. COVID-19 test negative.   URI  This is a recurrent problem. The current episode started 1 to 4 weeks ago. The problem has been gradually worsening. There has been no fever. Associated symptoms include congestion, coughing, ear pain, headaches, a plugged ear sensation, rhinorrhea, a sore throat and swollen glands. Pertinent negatives include no abdominal pain, chest pain, diarrhea, dysuria, joint pain, joint swelling,  nausea, neck pain, rash, sinus pain, sneezing, vomiting or wheezing. She has tried decongestant, antihistamine and acetaminophen for the symptoms. The treatment provided no relief.    Relevant past medical, surgical, family, and social history reviewed and updated as indicated.  Allergies and medications reviewed and updated.   Past Medical History:  Diagnosis Date   GERD (gastroesophageal reflux disease)     Past Surgical History:  Procedure Laterality Date   BREAST SURGERY     LASIK      Social History   Socioeconomic History   Marital status: Married    Spouse name: Not on file   Number of children: Not on file   Years of education: Not on file   Highest education level: Not on file  Occupational History   Not on file  Tobacco Use   Smoking status: Never   Smokeless tobacco: Never  Vaping Use   Vaping Use: Never used  Substance and Sexual Activity   Alcohol use: No   Drug use: No   Sexual activity: Not on file  Other Topics Concern   Not on file  Social History Narrative   Not on file   Social Determinants of Health   Financial Resource Strain: Not on file  Food Insecurity: Not on file  Transportation Needs: Not on file  Physical Activity: Not on file  Stress: Not on file  Social Connections: Not on file  Intimate Partner Violence: Not on file    Outpatient Encounter Medications as of 05/07/2021  Medication Sig   amoxicillin-clavulanate (AUGMENTIN) 875-125 MG tablet Take 1 tablet by mouth 2 (two) times daily for 10 days.  fluconazole (DIFLUCAN) 150 MG tablet Take 1 tablet (150 mg total) by mouth once for 1 dose.   dexamethasone (DECADRON) 6 MG tablet Take 1 tablet (6 mg total) by mouth 2 (two) times daily with a meal.   magic mouthwash (nystatin, lidocaine, diphenhydrAMINE) suspension Take 5 mLs by mouth 4 (four) times daily as needed for mouth pain.   pantoprazole (PROTONIX) 40 MG tablet TAKE 1 TABLET BY MOUTH TWICE A DAY   phentermine (ADIPEX-P) 37.5  MG tablet Take 1 tablet (37.5 mg total) by mouth daily before breakfast.   Polyethylene Glycol 3350 (MIRALAX PO) Take by mouth.   [DISCONTINUED] metroNIDAZOLE (FLAGYL) 500 MG tablet Take 1 tablet (500 mg total) by mouth 2 (two) times daily.   [DISCONTINUED] nitrofurantoin, macrocrystal-monohydrate, (MACROBID) 100 MG capsule Take 1 capsule (100 mg total) by mouth 2 (two) times daily.   No facility-administered encounter medications on file as of 05/07/2021.    No Known Allergies  Review of Systems  Constitutional:  Positive for activity change, appetite change and chills. Negative for diaphoresis, fatigue, fever and unexpected weight change.  HENT:  Positive for congestion, ear pain, postnasal drip, rhinorrhea, sore throat and voice change. Negative for dental problem, drooling, ear discharge, facial swelling, hearing loss, mouth sores, nosebleeds, sinus pressure, sinus pain, sneezing, tinnitus and trouble swallowing.   Eyes: Negative.   Respiratory:  Positive for cough. Negative for apnea, choking, chest tightness, shortness of breath, wheezing and stridor.   Cardiovascular:  Negative for chest pain, palpitations and leg swelling.  Gastrointestinal:  Negative for abdominal pain, blood in stool, constipation, diarrhea, nausea and vomiting.  Endocrine: Negative.   Genitourinary:  Negative for decreased urine volume, difficulty urinating, dysuria, frequency and urgency.  Musculoskeletal:  Negative for arthralgias, joint pain, myalgias and neck pain.  Skin: Negative.  Negative for rash.  Allergic/Immunologic: Negative.   Neurological:  Positive for headaches. Negative for dizziness, tremors, seizures, syncope, facial asymmetry, speech difficulty, weakness, light-headedness and numbness.  Hematological: Negative.   Psychiatric/Behavioral:  Negative for confusion, hallucinations, sleep disturbance and suicidal ideas.   All other systems reviewed and are negative.        Observations/Objective: No vital signs or physical exam, this was a telephone or virtual health encounter.  Pt alert and oriented, answers all questions appropriately, and able to speak in full sentences.    Assessment and Plan: Quincey was seen today for uri.  Diagnoses and all orders for this visit:  URI with cough and congestion Ongoing for over 7 days with worsening symptoms. Symptomatic care has not been beneficial. Will initiate Augmentin as prescribed. Continue symptomatic care. Report any new, worsening, or persistent symptoms.  -     amoxicillin-clavulanate (AUGMENTIN) 875-125 MG tablet; Take 1 tablet by mouth 2 (two) times daily for 10 days.  History of candidiasis Will send in diflucan to take after completion of antibiotic therapy.  -     fluconazole (DIFLUCAN) 150 MG tablet; Take 1 tablet (150 mg total) by mouth once for 1 dose.   Follow Up Instructions: Return if symptoms worsen or fail to improve.    I discussed the assessment and treatment plan with the patient. The patient was provided an opportunity to ask questions and all were answered. The patient agreed with the plan and demonstrated an understanding of the instructions.   The patient was advised to call back or seek an in-person evaluation if the symptoms worsen or if the condition fails to improve as anticipated.  The above assessment and  management plan was discussed with the patient. The patient verbalized understanding of and has agreed to the management plan. Patient is aware to call the clinic if they develop any new symptoms or if symptoms persist or worsen. Patient is aware when to return to the clinic for a follow-up visit. Patient educated on when it is appropriate to go to the emergency department.    I provided 14 minutes of non-face-to-face time during this encounter. The call started at 1415. The call ended at 1427. The other time was used for coordination of care.    Kari Baars,  FNP-C [facility] [address] [place], [place] [address] [phone] 05/07/2021

## 2021-06-11 ENCOUNTER — Other Ambulatory Visit: Payer: Self-pay | Admitting: Family Medicine

## 2021-06-24 ENCOUNTER — Ambulatory Visit: Payer: BC Managed Care – PPO | Admitting: Family Medicine

## 2021-06-24 ENCOUNTER — Encounter: Payer: Self-pay | Admitting: Family Medicine

## 2021-08-11 ENCOUNTER — Ambulatory Visit: Payer: BC Managed Care – PPO | Admitting: Family Medicine

## 2021-08-11 ENCOUNTER — Encounter: Payer: Self-pay | Admitting: Family Medicine

## 2021-08-11 VITALS — BP 108/61 | HR 78 | Temp 97.2°F | Ht 65.0 in | Wt 210.5 lb

## 2021-08-11 DIAGNOSIS — R233 Spontaneous ecchymoses: Secondary | ICD-10-CM | POA: Diagnosis not present

## 2021-08-11 DIAGNOSIS — R5383 Other fatigue: Secondary | ICD-10-CM | POA: Diagnosis not present

## 2021-08-11 NOTE — Patient Instructions (Signed)
Fatigue °If you have fatigue, you feel tired all the time and have a lack of energy or a lack of motivation. Fatigue may make it difficult to start or complete tasks because of exhaustion. In general, occasional or mild fatigue is often a normal response to activity or life. However, long-lasting (chronic) or extreme fatigue may be a symptom of a medical condition. °Follow these instructions at home: °General instructions °Watch your fatigue for any changes. °Go to bed and get up at the same time every day. °Avoid fatigue by pacing yourself during the day and getting enough sleep at night. °Maintain a healthy weight. °Medicines °Take over-the-counter and prescription medicines only as told by your health care provider. °Take a multivitamin, if told by your health care provider.  °Do not use herbal or dietary supplements unless they are approved by your health care provider. °Activity ° °Exercise regularly, as told by your health care provider. °Use or practice techniques to help you relax, such as yoga, tai chi, meditation, or massage therapy. °Eating and drinking ° °Avoid heavy meals in the evening. °Eat a well-balanced diet, which includes lean proteins, whole grains, plenty of fruits and vegetables, and low-fat dairy products. °Avoid consuming too much caffeine. °Avoid the use of alcohol. °Drink enough fluid to keep your urine pale yellow. °Lifestyle °Change situations that cause you stress. Try to keep your work and personal schedule in balance. °Do not use any products that contain nicotine or tobacco, such as cigarettes and e-cigarettes. If you need help quitting, ask your health care provider. °Do not use drugs. °Contact a health care provider if: °Your fatigue does not get better. °You have a fever. °You suddenly lose or gain weight. °You have headaches. °You have trouble falling asleep or sleeping through the night. °You feel angry, guilty, anxious, or sad. °You are unable to have a bowel movement  (constipation). °Your skin is dry. °You have swelling in your legs or another part of your body. °Get help right away if: °You feel confused. °Your vision is blurry. °You feel faint or you pass out. °You have a severe headache. °You have severe pain in your abdomen, your back, or the area between your waist and hips (pelvis). °You have chest pain, shortness of breath, or an irregular or fast heartbeat. °You are unable to urinate, or you urinate less than normal. °You have abnormal bleeding, such as bleeding from the rectum, vagina, nose, lungs, or nipples. °You vomit blood. °You have thoughts about hurting yourself or others. °If you ever feel like you may hurt yourself or others, or have thoughts about taking your own life, get help right away. You can go to your nearest emergency department or call: °Your local emergency services (911 in the [place] °A suicide crisis helpline, such as the National Suicide Prevention Lifeline at 1-800-273-8255 or 988 in the [place] This is open 24 hours a day. °Summary °If you have fatigue, you feel tired all the time and have a lack of energy or a lack of motivation. °Fatigue may make it difficult to start or complete tasks because of exhaustion. °Long-lasting (chronic) or extreme fatigue may be a symptom of a medical condition. °Exercise regularly, as told by your health care provider. °Change situations that cause you stress. Try to keep your work and personal schedule in balance. °This information is not intended to replace advice given to you by your health care provider. Make sure you discuss any questions you have with your health care provider. °Document Revised:   03/04/2021 Document Reviewed: 06/19/2020 °Elsevier Patient Education © 2022 [employer] ° °

## 2021-08-11 NOTE — Progress Notes (Signed)
° °Acute Office Visit ° °Subjective:  ° ° Patient ID: Sharon Foster, female    DOB: 01/22/1976, 45 y.o.   MRN: [id number] ° °Chief Complaint  °Patient presents with  ° Bleeding/Bruising  ° ° °HPI °Patient is in today for easy bruising. This has been ongoing for years. It seems to be worse lately. The bruises occur everywhere. She reports that just touching something can cause a bruise. For example, she bruised her hand recently when putting her hand into her pocket. She also reports that she feels more tired than she used to. She denies bleeding. Denies family history of bleeding disorders.  ° °Past Medical History:  °Diagnosis Date  ° GERD (gastroesophageal reflux disease)   ° ° °Past Surgical History:  °Procedure Laterality Date  ° BREAST SURGERY    ° LASIK    ° ° °Family History  °Problem Relation Age of Onset  ° Hypertension Mother   ° Cancer Father   °     Prostate  ° Hypertension Father   ° ° °Social History  ° °Socioeconomic History  ° Marital status: Married  °  Spouse name: Not on file  ° Number of children: Not on file  ° Years of education: Not on file  ° Highest education level: Not on file  °Occupational History  ° Not on file  °Tobacco Use  ° Smoking status: Never  ° Smokeless tobacco: Never  °Vaping Use  ° Vaping Use: Never used  °Substance and Sexual Activity  ° Alcohol use: No  ° Drug use: No  ° Sexual activity: Not on file  °Other Topics Concern  ° Not on file  °Social History Narrative  ° Not on file  ° °Social Determinants of Health  ° °Financial Resource Strain: Not on file  °Food Insecurity: Not on file  °Transportation Needs: Not on file  °Physical Activity: Not on file  °Stress: Not on file  °Social Connections: Not on file  °Intimate Partner Violence: Not on file  ° ° °Outpatient Medications Prior to Visit  °Medication Sig Dispense Refill  ° pantoprazole (PROTONIX) 40 MG tablet TAKE 1 TABLET BY MOUTH TWICE A DAY 180 tablet 3  ° magic mouthwash (nystatin, lidocaine, diphenhydrAMINE)  suspension Take 5 mLs by mouth 4 (four) times daily as needed for mouth pain. 180 mL 2  ° phentermine (ADIPEX-P) 37.5 MG tablet Take 1 tablet (37.5 mg total) by mouth daily before breakfast. 30 tablet 0  ° Polyethylene Glycol 3350 (MIRALAX PO) Take by mouth.    ° °No facility-administered medications prior to visit.  ° ° °No Known Allergies ° °Review of Systems °As per HPI.  °   °Objective:  °  °Physical Exam °Vitals and nursing note reviewed.  °Constitutional:   °   General: She is not in acute distress. °   Appearance: She is not ill-appearing, toxic-appearing or diaphoretic.  °HENT:  °   Head: Normocephalic and atraumatic.  °Eyes:  °   Extraocular Movements: Extraocular movements intact.  °   Pupils: Pupils are equal, round, and reactive to light.  °Cardiovascular:  °   Rate and Rhythm: Normal rate and regular rhythm.  °   Heart sounds: Normal heart sounds. No murmur heard. °Pulmonary:  °   Effort: Pulmonary effort is normal. No respiratory distress.  °   Breath sounds: Normal breath sounds.  °Musculoskeletal:  °   Right lower leg: No edema.  °   Left lower leg: No edema.  °Skin: °     General: Skin is warm and dry.  °   Findings: Bruising (small bruises present to forearms bilaterally) present. No erythema or rash.  °   Comments: Varicose veins present to bilateral legs  °Neurological:  °   Mental Status: She is alert and oriented to person, place, and time.  °   Gait: Gait normal.  °Psychiatric:     °   Mood and Affect: Mood normal.     °   Behavior: Behavior normal.  ° ° °BP 108/61    Pulse 78    Temp (!) 97.2 °F (36.2 °C) (Temporal)    Ht 5' 5" (1.651 m)    Wt 210 lb 8 oz (95.5 kg)    BMI 35.03 kg/m²  °Wt Readings from Last 3 Encounters:  °08/11/21 210 lb 8 oz (95.5 kg)  °03/24/21 208 lb 6.4 oz (94.5 kg)  °07/21/20 211 lb 4 oz (95.8 kg)  ° ° °Health Maintenance Due  °Topic Date Due  ° COVID-19 Vaccine (1) Never done  ° HIV Screening  Never done  ° Hepatitis C Screening  Never done  ° PAP SMEAR-Modifier   02/20/2017  ° ° °There are no preventive care reminders to display for this patient. ° ° °Lab Results  °Component Value Date  ° TSH 1.480 01/31/2020  ° °Lab Results  °Component Value Date  ° WBC 8.4 07/21/2020  ° HGB 13.8 07/21/2020  ° HCT 40.8 07/21/2020  ° MCV 95 07/21/2020  ° PLT 209 07/21/2020  ° °Lab Results  °Component Value Date  ° NA 140 07/21/2020  ° K 3.7 07/21/2020  ° CO2 24 07/21/2020  ° GLUCOSE 87 07/21/2020  ° BUN 10 07/21/2020  ° CREATININE 0.94 07/21/2020  ° BILITOT 0.9 07/21/2020  ° ALKPHOS 79 07/21/2020  ° AST 13 07/21/2020  ° ALT 7 07/21/2020  ° PROT 6.7 07/21/2020  ° ALBUMIN 3.8 07/21/2020  ° CALCIUM 9.0 07/21/2020  ° °Lab Results  °Component Value Date  ° CHOL 102 09/12/2015  ° °Lab Results  °Component Value Date  ° HDL 41 09/12/2015  ° °Lab Results  °Component Value Date  ° LDLCALC 51 09/12/2015  ° °Lab Results  °Component Value Date  ° TRIG 52 09/12/2015  ° °Lab Results  °Component Value Date  ° CHOLHDL 2.5 09/12/2015  ° °No results found for: HGBA1C ° °   °Assessment & Plan:  ° °[place]nee was seen today for bleeding/bruising. ° °Diagnoses and all orders for this visit: ° °Easy bruising °Labs pending as below. Will notify patient of results. Discussed referral to hematology pending results if warranted.  °-     CBC with Differential/Platelet °-     Protime-INR °-     APTT ° °Fatigue, unspecified type °Labs pending. Will notify patient of results. She is fasting.  °-     CBC with Differential/Platelet °-     Thyroid Panel With TSH °-     B12 and Folate Panel °-     CMP14+EGFR ° °Return to office for new or worsening symptoms, or if symptoms persist.  ° °The patient indicates understanding of these issues and agrees with the plan. ° °Tiffany M Morgan, FNP ° °

## 2021-08-12 ENCOUNTER — Other Ambulatory Visit: Payer: Self-pay | Admitting: Family Medicine

## 2021-08-12 DIAGNOSIS — R233 Spontaneous ecchymoses: Secondary | ICD-10-CM

## 2021-08-12 DIAGNOSIS — K219 Gastro-esophageal reflux disease without esophagitis: Secondary | ICD-10-CM

## 2021-08-12 LAB — APTT: aPTT: 28 s (ref 24–33)

## 2021-08-12 LAB — CBC WITH DIFFERENTIAL/PLATELET
Basophils Absolute: 0 10*3/uL (ref 0.0–0.2)
Basos: 1 %
EOS (ABSOLUTE): 0 10*3/uL (ref 0.0–0.4)
Eos: 1 %
Hematocrit: 40.7 % (ref 34.0–46.6)
Hemoglobin: 13.6 g/dL (ref 11.1–15.9)
Immature Grans (Abs): 0 10*3/uL (ref 0.0–0.1)
Immature Granulocytes: 0 %
Lymphocytes Absolute: 2.6 10*3/uL (ref 0.7–3.1)
Lymphs: 39 %
MCH: 31.9 pg (ref 26.6–33.0)
MCHC: 33.4 g/dL (ref 31.5–35.7)
MCV: 96 fL (ref 79–97)
Monocytes Absolute: 0.4 10*3/uL (ref 0.1–0.9)
Monocytes: 6 %
Neutrophils Absolute: 3.6 10*3/uL (ref 1.4–7.0)
Neutrophils: 53 %
Platelets: 169 10*3/uL (ref 150–450)
RBC: 4.26 x10E6/uL (ref 3.77–5.28)
RDW: 11.8 % (ref 11.7–15.4)
WBC: 6.7 10*3/uL (ref 3.4–10.8)

## 2021-08-12 LAB — CMP14+EGFR
ALT: 11 IU/L (ref 0–32)
AST: 14 IU/L (ref 0–40)
Albumin/Globulin Ratio: 2 (ref 1.2–2.2)
Albumin: 4.1 g/dL (ref 3.8–4.8)
Alkaline Phosphatase: 72 IU/L (ref 44–121)
BUN/Creatinine Ratio: 14 (ref 9–23)
BUN: 14 mg/dL (ref 6–24)
Bilirubin Total: 0.8 mg/dL (ref 0.0–1.2)
CO2: 23 mmol/L (ref 20–29)
Calcium: 8.9 mg/dL (ref 8.7–10.2)
Chloride: 105 mmol/L (ref 96–106)
Creatinine, Ser: 1 mg/dL (ref 0.57–1.00)
Globulin, Total: 2.1 g/dL (ref 1.5–4.5)
Glucose: 84 mg/dL (ref 70–99)
Potassium: 3.6 mmol/L (ref 3.5–5.2)
Sodium: 141 mmol/L (ref 134–144)
Total Protein: 6.2 g/dL (ref 6.0–8.5)
eGFR: 71 mL/min/{1.73_m2} (ref >59)

## 2021-08-12 LAB — B12 AND FOLATE PANEL
Folate: 12.4 ng/mL (ref >3.0)
Vitamin B-12: 298 pg/mL (ref 232–1245)

## 2021-08-12 LAB — THYROID PANEL WITH TSH
Free Thyroxine Index: 1.8 (ref 1.2–4.9)
T3 Uptake Ratio: 27 % (ref 24–39)
T4, Total: 6.5 ug/dL (ref 4.5–12.0)
TSH: 1.87 u[IU]/mL (ref 0.450–4.500)

## 2021-08-12 LAB — PROTIME-INR
INR: 1 (ref 0.9–1.2)
Prothrombin Time: 10.3 s (ref 9.1–12.0)

## 2021-08-12 MED ORDER — FAMOTIDINE 20 MG PO TABS
20.0000 mg | ORAL_TABLET | Freq: Two times a day (BID) | ORAL | 0 refills | Status: DC
Start: 2021-08-12 — End: 2021-09-08

## 2021-09-04 ENCOUNTER — Ambulatory Visit: Payer: BC Managed Care – PPO | Admitting: Family Medicine

## 2021-09-04 DIAGNOSIS — B3731 Acute candidiasis of vulva and vagina: Secondary | ICD-10-CM

## 2021-09-04 MED ORDER — FLUCONAZOLE 150 MG PO TABS: ORAL_TABLET | ORAL | 0 refills | Status: DC

## 2021-09-04 NOTE — Progress Notes (Signed)
Telephone visit  Subjective: CC:Yeast infection PCP: Mechele Claude, MD YNW:[id number] Sharon Foster is a 46 y.o. female calls for telephone consult today. Patient provides verbal consent for consult held via phone.  Due to COVID-19 pandemic this visit was conducted virtually. This visit type was conducted due to national recommendations for restrictions regarding the COVID-19 Pandemic (e.g. social distancing, sheltering in place) in an effort to limit this patient's exposure and mitigate transmission in our community. All issues noted in this document were discussed and addressed.  A physical exam was not performed with this format.   Location of patient: home Location of provider: [facility] Others present for call: none  1. Yeast infection Patient reports that she is been struggling with recurrent yeast infections for quite some time now.  She is having cottage cheese discharge and vaginal irritation but denies overt itching.   ROS: Per HPI  No Known Allergies Past Medical History:  Diagnosis Date   GERD (gastroesophageal reflux disease)     Current Outpatient Medications:    famotidine (PEPCID) 20 MG tablet, Take 1 tablet (20 mg total) by mouth 2 (two) times daily., Disp: 60 tablet, Rfl: 0   pantoprazole (PROTONIX) 40 MG tablet, TAKE 1 TABLET BY MOUTH TWICE A DAY, Disp: 180 tablet, Rfl: 3  Assessment/ Plan: 46 y.o. female   Recurrent candidiasis of vagina - Plan: fluconazole (DIFLUCAN) 150 MG tablet  Diflucan sent.  We will have her stay on this for 6 months for recurrent vulvovaginal candidiasis.  Discussed instructions for use.  Additionally, she mentioned that she would like to discuss with Dr. Darlyn Read possibly getting tested for lupus.  I will CC the today's chart to him as [facility] time: 11:05a End time: 11:14am  Total time spent on patient care (including telephone call/ virtual visit):  9 minutes  Kendan Cornforth Hulen Skains, DO [facility] [phone]

## 2021-09-08 ENCOUNTER — Other Ambulatory Visit: Payer: Self-pay | Admitting: Family Medicine

## 2021-09-08 DIAGNOSIS — K219 Gastro-esophageal reflux disease without esophagitis: Secondary | ICD-10-CM

## 2021-09-11 ENCOUNTER — Telehealth: Payer: Self-pay | Admitting: Family Medicine

## 2021-09-14 NOTE — Telephone Encounter (Signed)
Okay with me 

## 2021-09-14 NOTE — Telephone Encounter (Signed)
DIFLUCAN NOT WORKING

## 2021-09-14 NOTE — Telephone Encounter (Signed)
I spoke to pt and she says over the last couple of days her symptoms are getting better.  Pt would like to change her PCP to Dr Reece Agar from Dr Darlyn Read. Please advise.

## 2021-09-14 NOTE — Telephone Encounter (Signed)
Pt. Needs to be seen for exam for this. Thanks, WS

## 2021-09-14 NOTE — Telephone Encounter (Signed)
I spoke to the patient and her husband today.  I am glad to start seeing Sharon Foster.

## 2021-09-15 NOTE — Telephone Encounter (Signed)
Pt aware / PCP changed  

## 2021-09-16 ENCOUNTER — Encounter: Payer: Self-pay | Admitting: Family Medicine

## 2021-09-16 ENCOUNTER — Telehealth: Payer: Self-pay | Admitting: Family Medicine

## 2021-09-16 ENCOUNTER — Ambulatory Visit: Payer: BC Managed Care – PPO | Admitting: Family Medicine

## 2021-09-16 VITALS — BP 117/75 | HR 69 | Temp 98.9°F | Ht 65.0 in | Wt 212.0 lb

## 2021-09-16 DIAGNOSIS — I7300 Raynaud's syndrome without gangrene: Secondary | ICD-10-CM

## 2021-09-16 MED ORDER — NIFEDIPINE 10 MG PO CAPS: 10.0000 mg | ORAL_CAPSULE | Freq: Every day | ORAL | 0 refills | Status: DC

## 2021-09-16 NOTE — Telephone Encounter (Signed)
Likely Raynaud's phenomenon.  Unlikely to be lupus

## 2021-09-16 NOTE — Progress Notes (Addendum)
Subjective:  Patient ID: Sharon Foster, female    DOB: 1976-07-07, 46 y.o.   MRN: [id number]  Patient Care Team: Janora Norlander, DO as PCP - General (Family Medicine)   Chief Complaint:  numbness tingling bilateral extremities   HPI: Sharon Foster is a 46 y.o. female presenting on 09/16/2021 for numbness tingling bilateral extremities   Pt presents with concerns regarding coldness and tingling. She states her fingers and toes lose color. She is to see hematology in march regarding frequent bruising and the previously mentioned concerns.      Relevant past medical, surgical, family, and social history reviewed and updated as indicated.  Allergies and medications reviewed and updated. Data reviewed: Chart in Epic.   Past Medical History:  Diagnosis Date   GERD (gastroesophageal reflux disease)     Past Surgical History:  Procedure Laterality Date   BREAST SURGERY     LASIK      Social History   Socioeconomic History   Marital status: Married    Spouse name: Not on file   Number of children: Not on file   Years of education: Not on file   Highest education level: Not on file  Occupational History   Not on file  Tobacco Use   Smoking status: Never   Smokeless tobacco: Never  Vaping Use   Vaping Use: Never used  Substance and Sexual Activity   Alcohol use: No   Drug use: No   Sexual activity: Not on file  Other Topics Concern   Not on file  Social History Narrative   Not on file   Social Determinants of Health   Financial Resource Strain: Not on file  Food Insecurity: Not on file  Transportation Needs: Not on file  Physical Activity: Not on file  Stress: Not on file  Social Connections: Not on file  Intimate Partner Violence: Not on file    Outpatient Encounter Medications as of 09/16/2021  Medication Sig   famotidine (PEPCID) 20 MG tablet Take 1 tablet (20 mg total) by mouth 2 (two) times daily. (NEEDS TO BE SEEN BEFORE NEXT REFILL)    fluconazole (DIFLUCAN) 150 MG tablet Take 1 tablet every 3 days x3 doses.  Then start taking 1 tablet ONCE per week for 6 months.   imiquimod (ALDARA) 5 % cream SMARTSIG:Topical Daily on Weekdays   NIFEdipine (PROCARDIA) 10 MG capsule Take 1 capsule (10 mg total) by mouth daily.   pantoprazole (PROTONIX) 40 MG tablet TAKE 1 TABLET BY MOUTH TWICE A DAY   No facility-administered encounter medications on file as of 09/16/2021.    No Known Allergies  Review of Systems  Constitutional:  Negative for activity change, appetite change, chills, diaphoresis, fatigue, fever and unexpected weight change.  Respiratory:  Negative for chest tightness and shortness of breath.   Cardiovascular:  Negative for chest pain, palpitations and leg swelling.  Gastrointestinal:  Negative for abdominal pain.  Endocrine: Negative for cold intolerance.  Genitourinary:  Negative for decreased urine volume and difficulty urinating.  Skin:  Positive for color change and pallor.       Loss of color and/or cyanosis in fingers toes.   Neurological:  Positive for numbness (in fingers and toes.). Negative for dizziness, tremors, seizures, syncope, facial asymmetry, speech difficulty, weakness, light-headedness and headaches.  All other systems reviewed and are negative.     Objective:  BP 117/75    Pulse 69    Temp 98.9 F (37.2 C)  Ht '5\' 5"'  (1.651 m)    Wt 212 lb (96.2 kg)    SpO2 100%    BMI 35.28 kg/m    Wt Readings from Last 3 Encounters:  09/16/21 212 lb (96.2 kg)  08/11/21 210 lb 8 oz (95.5 kg)  03/24/21 208 lb 6.4 oz (94.5 kg)    Physical Exam Vitals and nursing note reviewed.  Constitutional:      General: She is not in acute distress.    Appearance: Normal appearance. She is obese. She is not ill-appearing, toxic-appearing or diaphoretic.  HENT:     Head: Normocephalic and atraumatic.     Nose: Nose normal.  Eyes:     Conjunctiva/sclera: Conjunctivae normal.     Pupils: Pupils are equal, round,  and reactive to light.  Cardiovascular:     Rate and Rhythm: Normal rate and regular rhythm.     Pulses: Normal pulses.          Radial pulses are 2+ on the right side and 2+ on the left side.       Popliteal pulses are 2+ on the right side and 2+ on the left side.     Heart sounds: Normal heart sounds.  Pulmonary:     Effort: Pulmonary effort is normal.  Musculoskeletal:        General: Normal range of motion.     Cervical back: Normal range of motion.     Right lower leg: No edema.     Left lower leg: No edema.  Skin:    General: Skin is warm and dry.     Capillary Refill: Capillary refill takes less than 2 seconds.     Coloration: Skin is cyanotic (fingers and toes).  Neurological:     General: No focal deficit present.     Mental Status: She is alert and oriented to person, place, and time.  Psychiatric:        Mood and Affect: Mood normal.        Thought Content: Thought content normal.        Judgment: Judgment normal.    Results for orders placed or performed in visit on 08/11/21  CBC with Differential/Platelet  Result Value Ref Range   WBC 6.7 3.4 - 10.8 x10E3/uL   RBC 4.26 3.77 - 5.28 x10E6/uL   Hemoglobin 13.6 11.1 - 15.9 g/dL   Hematocrit 40.7 34.0 - 46.6 %   MCV 96 79 - 97 fL   MCH 31.9 26.6 - 33.0 pg   MCHC 33.4 31.5 - 35.7 g/dL   RDW 11.8 11.7 - 15.4 %   Platelets 169 150 - 450 x10E3/uL   Neutrophils 53 Not Estab. %   Lymphs 39 Not Estab. %   Monocytes 6 Not Estab. %   Eos 1 Not Estab. %   Basos 1 Not Estab. %   Neutrophils Absolute 3.6 1.4 - 7.0 x10E3/uL   Lymphocytes Absolute 2.6 0.7 - 3.1 x10E3/uL   Monocytes Absolute 0.4 0.1 - 0.9 x10E3/uL   EOS (ABSOLUTE) 0.0 0.0 - 0.4 x10E3/uL   Basophils Absolute 0.0 0.0 - 0.2 x10E3/uL   Immature Granulocytes 0 Not Estab. %   Immature Grans (Abs) 0.0 0.0 - 0.1 x10E3/uL  Protime-INR  Result Value Ref Range   INR 1.0 0.9 - 1.2   Prothrombin Time 10.3 9.1 - 12.0 sec  APTT  Result Value Ref Range   aPTT 28 24  - 33 sec  Thyroid Panel With TSH  Result Value Ref Range  TSH 1.870 0.450 - 4.500 uIU/mL   T4, Total 6.5 4.5 - 12.0 ug/dL   T3 Uptake Ratio 27 24 - 39 %   Free Thyroxine Index 1.8 1.2 - 4.9  B12 and Folate Panel  Result Value Ref Range   Vitamin B-12 298 232 - 1,245 pg/mL   Folate 12.4 >3.0 ng/mL  CMP14+EGFR  Result Value Ref Range   Glucose 84 70 - 99 mg/dL   BUN 14 6 - 24 mg/dL   Creatinine, Ser 1.00 0.57 - 1.00 mg/dL   eGFR 71 >59 mL/min/1.73   BUN/Creatinine Ratio 14 9 - 23   Sodium 141 134 - 144 mmol/L   Potassium 3.6 3.5 - 5.2 mmol/L   Chloride 105 96 - 106 mmol/L   CO2 23 20 - 29 mmol/L   Calcium 8.9 8.7 - 10.2 mg/dL   Total Protein 6.2 6.0 - 8.5 g/dL   Albumin 4.1 3.8 - 4.8 g/dL   Globulin, Total 2.1 1.5 - 4.5 g/dL   Albumin/Globulin Ratio 2.0 1.2 - 2.2   Bilirubin Total 0.8 0.0 - 1.2 mg/dL   Alkaline Phosphatase 72 44 - 121 IU/L   AST 14 0 - 40 IU/L   ALT 11 0 - 32 IU/L       Pertinent labs & imaging results that were available during my care of the patient were reviewed by me and considered in my medical decision making.  Assessment & Plan:  Sharon Foster was seen today for numbness tingling bilateral extremities.  Diagnoses and all orders for this visit:  Raynaud's disease without gangrene -Will start nifedipine 22m daily and return in 2 weeks for BP check. Wear gloves and socks, avoid cold temperatures.     Continue all other maintenance medications.  Follow up plan: Return in 2 weeks (on 09/30/2021), or if symptoms worsen or fail to improve.   Continue healthy lifestyle choices, including diet (rich in fruits, vegetables, and lean proteins, and low in salt and simple carbohydrates) and exercise (at least 30 minutes of moderate physical activity daily).  Educational handout given for Raynaud's.   The above assessment and management plan was discussed with the patient. The patient verbalized understanding of and has agreed to the management plan. Patient is  aware to call the clinic if they develop any new symptoms or if symptoms persist or worsen. Patient is aware when to return to the clinic for a follow-up visit. Patient educated on when it is appropriate to go to the emergency department.   AAnnie Main NP Student  I personally was present during the history, physical exam, and medical decision-making activities of this visit and have verified that the services and findings are accurately documented in the nurse practitioner student's note.  MMonia Pouch FNP-C [facility] [address] [place] [phone]

## 2021-09-16 NOTE — Telephone Encounter (Signed)
Pt thinks her symptoms- left finger tip numb and blue. She says she has kind of been self diagnosing and wants to make sure she does not have lupus. She has appt today with michelle at 2pm

## 2021-10-01 ENCOUNTER — Ambulatory Visit: Payer: BC Managed Care – PPO | Admitting: Family Medicine

## 2021-10-01 ENCOUNTER — Other Ambulatory Visit: Payer: Self-pay | Admitting: Family Medicine

## 2021-10-01 ENCOUNTER — Telehealth: Payer: Self-pay | Admitting: Family Medicine

## 2021-10-01 ENCOUNTER — Encounter: Payer: Self-pay | Admitting: Family Medicine

## 2021-10-01 VITALS — BP 98/53 | HR 78 | Temp 98.6°F

## 2021-10-01 DIAGNOSIS — K219 Gastro-esophageal reflux disease without esophagitis: Secondary | ICD-10-CM

## 2021-10-01 DIAGNOSIS — I7300 Raynaud's syndrome without gangrene: Secondary | ICD-10-CM | POA: Diagnosis not present

## 2021-10-01 MED ORDER — ESOMEPRAZOLE MAGNESIUM 40 MG PO CPDR: 40.0000 mg | DELAYED_RELEASE_CAPSULE | Freq: Every day | ORAL | 3 refills | Status: DC

## 2021-10-01 MED ORDER — NIFEDIPINE 10 MG PO CAPS: 10.0000 mg | ORAL_CAPSULE | Freq: Two times a day (BID) | ORAL | 1 refills | Status: AC

## 2021-10-01 NOTE — Progress Notes (Addendum)
Subjective:  Patient ID: Sharon Foster, female    DOB: [birth year], 46 y.o.   MRN: [id number]  Patient Care Team: Janora Norlander, DO as PCP - General (Family Medicine)   Chief Complaint:  Blood Pressure Check   HPI: Sharon Foster is a 46 y.o. female presenting on 10/01/2021 for Blood Pressure Check   Pt presents for a blood pressure recheck 2 weeks after initiation of 20 mg nifedipine for Raynaud's phenomenon. BP 98/53 today, patient declines dizziness, light-headedness or changes to vision. She states mild improvement to Raynauds symptoms including numbness, tingling, and color.  Patient also states concern for bruising. She has concerns that is secondary to Protonix. She has an appointment with hematology next month.     Relevant past medical, surgical, family, and social history reviewed and updated as indicated.  Allergies and medications reviewed and updated. Data reviewed: Chart in Epic.   Past Medical History:  Diagnosis Date   GERD (gastroesophageal reflux disease)     Past Surgical History:  Procedure Laterality Date   BREAST SURGERY     LASIK      Social History   Socioeconomic History   Marital status: Married    Spouse name: Not on file   Number of children: Not on file   Years of education: Not on file   Highest education level: Not on file  Occupational History   Not on file  Tobacco Use   Smoking status: Never   Smokeless tobacco: Never  Vaping Use   Vaping Use: Never used  Substance and Sexual Activity   Alcohol use: No   Drug use: No   Sexual activity: Not on file  Other Topics Concern   Not on file  Social History Narrative   Not on file   Social Determinants of Health   Financial Resource Strain: Not on file  Food Insecurity: Not on file  Transportation Needs: Not on file  Physical Activity: Not on file  Stress: Not on file  Social Connections: Not on file  Intimate Partner Violence: Not on file    Outpatient  Encounter Medications as of 10/01/2021  Medication Sig   esomeprazole (NEXIUM) 40 MG capsule Take 1 capsule (40 mg total) by mouth daily.   fluconazole (DIFLUCAN) 150 MG tablet Take 1 tablet every 3 days x3 doses.  Then start taking 1 tablet ONCE per week for 6 months.   imiquimod (ALDARA) 5 % cream SMARTSIG:Topical Daily on Weekdays   NIFEdipine (PROCARDIA) 10 MG capsule Take 1 capsule (10 mg total) by mouth daily.   [DISCONTINUED] famotidine (PEPCID) 20 MG tablet Take 1 tablet (20 mg total) by mouth 2 (two) times daily. (NEEDS TO BE SEEN BEFORE NEXT REFILL)   [DISCONTINUED] pantoprazole (PROTONIX) 40 MG tablet TAKE 1 TABLET BY MOUTH TWICE A DAY   No facility-administered encounter medications on file as of 10/01/2021.    No Known Allergies  Review of Systems  Constitutional:  Negative for activity change, appetite change, chills, diaphoresis, fatigue, fever and unexpected weight change.  Eyes:  Negative for photophobia and visual disturbance.  Respiratory:  Negative for shortness of breath.   Cardiovascular:  Negative for chest pain, palpitations and leg swelling.  Genitourinary:  Negative for decreased urine volume.  Skin:  Positive for color change (blue of fingers and toes) and pallor.  Neurological:  Negative for dizziness, tremors, seizures, syncope, facial asymmetry, speech difficulty, weakness, light-headedness, numbness and headaches.  Hematological:  Bruises/bleeds easily.  Psychiatric/Behavioral:  Negative for confusion.   All other systems reviewed and are negative.      Objective:  BP (!) 98/53    Pulse 78    Temp 98.6 F (37 C) (Temporal)    Wt Readings from Last 3 Encounters:  09/16/21 212 lb (96.2 kg)  08/11/21 210 lb 8 oz (95.5 kg)  03/24/21 208 lb 6.4 oz (94.5 kg)    Physical Exam Vitals and nursing note reviewed.  Constitutional:      Appearance: Normal appearance. She is normal weight.  HENT:     Head: Normocephalic and atraumatic.  Eyes:      Conjunctiva/sclera: Conjunctivae normal.     Pupils: Pupils are equal, round, and reactive to light.  Cardiovascular:     Rate and Rhythm: Normal rate and regular rhythm.     Heart sounds: Normal heart sounds. No murmur heard.   No friction rub. No gallop.  Pulmonary:     Effort: Pulmonary effort is normal.     Breath sounds: Normal breath sounds.  Musculoskeletal:     Cervical back: Normal range of motion and neck supple.  Skin:    General: Skin is warm and dry.     Capillary Refill: Capillary refill takes less than 2 seconds.     Coloration: Skin is cyanotic (fingers) and pale (fingers).  Neurological:     General: No focal deficit present.     Mental Status: She is alert and oriented to person, place, and time.  Psychiatric:        Mood and Affect: Mood normal.        Behavior: Behavior normal.        Thought Content: Thought content normal.        Judgment: Judgment normal.    Results for orders placed or performed in visit on 08/11/21  CBC with Differential/Platelet  Result Value Ref Range   WBC 6.7 3.4 - 10.8 x10E3/uL   RBC 4.26 3.77 - 5.28 x10E6/uL   Hemoglobin 13.6 11.1 - 15.9 g/dL   Hematocrit 40.7 34.0 - 46.6 %   MCV 96 79 - 97 fL   MCH 31.9 26.6 - 33.0 pg   MCHC 33.4 31.5 - 35.7 g/dL   RDW 11.8 11.7 - 15.4 %   Platelets 169 150 - 450 x10E3/uL   Neutrophils 53 Not Estab. %   Lymphs 39 Not Estab. %   Monocytes 6 Not Estab. %   Eos 1 Not Estab. %   Basos 1 Not Estab. %   Neutrophils Absolute 3.6 1.4 - 7.0 x10E3/uL   Lymphocytes Absolute 2.6 0.7 - 3.1 x10E3/uL   Monocytes Absolute 0.4 0.1 - 0.9 x10E3/uL   EOS (ABSOLUTE) 0.0 0.0 - 0.4 x10E3/uL   Basophils Absolute 0.0 0.0 - 0.2 x10E3/uL   Immature Granulocytes 0 Not Estab. %   Immature Grans (Abs) 0.0 0.0 - 0.1 x10E3/uL  Protime-INR  Result Value Ref Range   INR 1.0 0.9 - 1.2   Prothrombin Time 10.3 9.1 - 12.0 sec  APTT  Result Value Ref Range   aPTT 28 24 - 33 sec  Thyroid Panel With TSH  Result Value  Ref Range   TSH 1.870 0.450 - 4.500 uIU/mL   T4, Total 6.5 4.5 - 12.0 ug/dL   T3 Uptake Ratio 27 24 - 39 %   Free Thyroxine Index 1.8 1.2 - 4.9  B12 and Folate Panel  Result Value Ref Range   Vitamin B-12 298 232 - 1,245 pg/mL   Folate  12.4 >3.0 ng/mL  CMP14+EGFR  Result Value Ref Range   Glucose 84 70 - 99 mg/dL   BUN 14 6 - 24 mg/dL   Creatinine, Ser 1.00 0.57 - 1.00 mg/dL   eGFR 71 >59 mL/min/1.73   BUN/Creatinine Ratio 14 9 - 23   Sodium 141 134 - 144 mmol/L   Potassium 3.6 3.5 - 5.2 mmol/L   Chloride 105 96 - 106 mmol/L   CO2 23 20 - 29 mmol/L   Calcium 8.9 8.7 - 10.2 mg/dL   Total Protein 6.2 6.0 - 8.5 g/dL   Albumin 4.1 3.8 - 4.8 g/dL   Globulin, Total 2.1 1.5 - 4.5 g/dL   Albumin/Globulin Ratio 2.0 1.2 - 2.2   Bilirubin Total 0.8 0.0 - 1.2 mg/dL   Alkaline Phosphatase 72 44 - 121 IU/L   AST 14 0 - 40 IU/L   ALT 11 0 - 32 IU/L       Pertinent labs & imaging results that were available during my care of the patient were reviewed by me and considered in my medical decision making.  Assessment & Plan:  Sharon Foster was seen today for blood pressure check.  Diagnoses and all orders for this visit:  Gastroesophageal reflux disease without esophagitis Bruising with protonix, will switch to nexium as pt has tolerated in the past -     esomeprazole (NEXIUM) 40 MG capsule; Take 1 capsule (40 mg total) by mouth daily.  Raynaud's disease without gangrene Slightly improving with current regimen, continue.     Continue all other maintenance medications. Sharon Foster was seen today for blood pressure check.  Diagnoses and all orders for this visit:  Gastroesophageal reflux disease without esophagitis -Will discontinue Protonix and famotidine as patient reports frequent bruising. Will start Nexium 40 mg.   Raynaud's disease without gangrene -Mild improvement in symptoms with 20 mg daily of nifedipine. Reviewed symptoms of hypotension with patient. Will continue current dose.  Patient to see heme in March.    Follow up plan: Follow up in 6 weeks with Dr. Darnell Level.    Continue healthy lifestyle choices, including diet (rich in fruits, vegetables, and lean proteins, and low in salt and simple carbohydrates) and exercise (at least 30 minutes of moderate physical activity daily).    The above assessment and management plan was discussed with the patient. The patient verbalized understanding of and has agreed to the management plan. Patient is aware to call the clinic if they develop any new symptoms or if symptoms persist or worsen. Patient is aware when to return to the clinic for a follow-up visit. Patient educated on when it is appropriate to go to the emergency department.   Deidre Ala, NP-S  I personally was present during the history, physical exam, and medical decision-making activities of this visit and have verified that the services and findings are accurately documented in the nurse practitioner student's note.  Monia Pouch, FNP-C [facility] [address] [place], [place] [address] [phone]

## 2021-10-01 NOTE — Addendum Note (Signed)
Addended by: Sonny Masters on: 10/01/2021 03:44 PM   Modules accepted: Orders

## 2021-10-01 NOTE — Telephone Encounter (Signed)
RX is called in per [facility]

## 2021-10-01 NOTE — Addendum Note (Signed)
Addended by: Sonny Masters on: 10/01/2021 02:32 PM   Modules accepted: Orders

## 2021-12-02 ENCOUNTER — Encounter: Payer: Self-pay | Admitting: Family Medicine

## 2021-12-02 ENCOUNTER — Ambulatory Visit: Payer: BC Managed Care – PPO | Admitting: Family Medicine

## 2021-12-02 VITALS — BP 105/71 | HR 67 | Temp 97.0°F | Ht 65.0 in | Wt 207.8 lb

## 2021-12-02 DIAGNOSIS — B3731 Acute candidiasis of vulva and vagina: Secondary | ICD-10-CM | POA: Diagnosis not present

## 2021-12-02 DIAGNOSIS — K219 Gastro-esophageal reflux disease without esophagitis: Secondary | ICD-10-CM | POA: Diagnosis not present

## 2021-12-02 DIAGNOSIS — E669 Obesity, unspecified: Secondary | ICD-10-CM

## 2021-12-02 DIAGNOSIS — R233 Spontaneous ecchymoses: Secondary | ICD-10-CM

## 2021-12-02 DIAGNOSIS — Z713 Dietary counseling and surveillance: Secondary | ICD-10-CM

## 2021-12-02 LAB — BAYER DCA HB A1C WAIVED: HB A1C (BAYER DCA - WAIVED): 4.7 % — ABNORMAL LOW (ref 4.8–5.6)

## 2021-12-02 MED ORDER — WEGOVY 1 MG/0.5ML ~~LOC~~ SOAJ: 1.0000 mg | SUBCUTANEOUS | 0 refills | Status: DC

## 2021-12-02 MED ORDER — WEGOVY 1.7 MG/0.75ML ~~LOC~~ SOAJ: 1.7000 mg | SUBCUTANEOUS | 0 refills | Status: DC

## 2021-12-02 MED ORDER — WEGOVY 0.5 MG/0.5ML ~~LOC~~ SOAJ: 0.5000 mg | SUBCUTANEOUS | 0 refills | Status: DC

## 2021-12-02 NOTE — Progress Notes (Signed)
? ?Subjective: ?CC: Weight loss counseling, follow-up recurrent candidiasis ?PCP: Janora Norlander, DO ?Sharon Foster is a 46 y.o. female presenting to clinic today for: ? ?1.  Weight loss counseling ?Patient suffers from obesity and is very interested in pursuing injection therapy for weight loss.  She has previously tried phentermine on multiple occasions and this was a little overstimulating and drying.  Of course the weight loss was not sustained either.  She is very physically active and eats usually only 1 meal per day but still has problems losing weight.  She suffers from GERD and is on a PPI for this. ? ?2.  Recurrent candidiasis of the vagina ?Patient was started on a very long course of Diflucan for her recurrent yeast vaginitis.  She notes that this is working extremely well and she is had no recurrence since initiation in January.  The plans were to continue this through July and she simply wants to make sure that she has sufficient refills. ? ?3.  Easy bruising ?With regards to her easy bruising, this was determined to be nonpathologic at her recent appointment with and just something that she will live with. ? ? ?ROS: Per HPI ? ?No Known Allergies ?Past Medical History:  ?Diagnosis Date  ? GERD (gastroesophageal reflux disease)   ? ? ?Current Outpatient Medications:  ?  esomeprazole (NEXIUM) 40 MG capsule, Take 1 capsule (40 mg total) by mouth daily., Disp: 30 capsule, Rfl: 3 ?  imiquimod (ALDARA) 5 % cream, SMARTSIG:Topical Daily on Weekdays, Disp: , Rfl:  ?  NIFEdipine (PROCARDIA) 10 MG capsule, Take 1 capsule (10 mg total) by mouth in the morning and at bedtime., Disp: 60 capsule, Rfl: 1 ?  fluconazole (DIFLUCAN) 150 MG tablet, Take 1 tablet every 3 days x3 doses.  Then start taking 1 tablet ONCE per week for 6 months. (Patient not taking: Reported on 12/02/2021), Disp: 30 tablet, Rfl: 0 ?Social History  ? ?Socioeconomic History  ? Marital status: Married  ?  Spouse name: Not on file  ?  Number of children: Not on file  ? Years of education: Not on file  ? Highest education level: Not on file  ?Occupational History  ? Not on file  ?Tobacco Use  ? Smoking status: Never  ? Smokeless tobacco: Never  ?Vaping Use  ? Vaping Use: Never used  ?Substance and Sexual Activity  ? Alcohol use: No  ? Drug use: No  ? Sexual activity: Not on file  ?Other Topics Concern  ? Not on file  ?Social History Narrative  ? Not on file  ? ?Social Determinants of Health  ? ?Financial Resource Strain: Not on file  ?Food Insecurity: Not on file  ?Transportation Needs: Not on file  ?Physical Activity: Not on file  ?Stress: Not on file  ?Social Connections: Not on file  ?Intimate Partner Violence: Not on file  ? ?Family History  ?Problem Relation Age of Onset  ? Hypertension Mother   ? Cancer Father   ?     Prostate  ? Hypertension Father   ? ? ?Objective: ?Office vital signs reviewed. ?BP 105/71   Pulse 67   Temp (!) 97 ?F (36.1 ?C)   Ht '5\' 5"'  (1.651 m)   Wt 207 lb 12.8 oz (94.3 kg)   SpO2 98%   BMI 34.58 kg/m?  ? ?Physical Examination:  ?General: Awake, alert, well nourished, No acute distress ?HEENT: Sclera white.  Moist mucous membranes ?Cardio: regular rate and rhythm, S1S2 heard, no  murmurs appreciated ?Pulm: clear to auscultation bilaterally, no wheezes, rhonchi or rales; normal work of breathing on room air ?MSK: normal gait and station ?Skin: No gross ecchymosis noted on exam today ? ?Assessment/ Plan: ?46 y.o. female  ? ?Recurrent candidiasis of vagina ? ?Obesity (BMI 30.0-34.9) - Plan: Bayer DCA Hb A1c Waived, Lipid Panel, CMP14+EGFR, Semaglutide-Weight Management (WEGOVY) 0.5 MG/0.5ML SOAJ, Semaglutide-Weight Management (WEGOVY) 1.7 MG/0.75ML SOAJ, Semaglutide-Weight Management (WEGOVY) 1 MG/0.5ML SOAJ ? ?Weight loss counseling, encounter for - Plan: Bayer DCA Hb A1c Waived, Lipid Panel, CMP14+EGFR ? ?Gastroesophageal reflux disease without esophagitis - Plan: Semaglutide-Weight Management (WEGOVY) 0.5 MG/0.5ML  SOAJ, Semaglutide-Weight Management (WEGOVY) 1.7 MG/0.75ML SOAJ, Semaglutide-Weight Management (WEGOVY) 1 MG/0.5ML SOAJ ? ?Easy bruising ? ?Doing well with the Diflucan.  She will continue this as directed until July.  She should have plenty of medication left over as #30 were sent in January to last through July ? ?I have started her on Wegovy.  Indications include obesity, GERD.  She has failed phentermine, intermittent fasting, increase physical exercise.  First injection was performed together today.  Possible side effects discussed.  Recommendation for small meals, increase water intake and avoidance of carbonated beverages.  Fasting labs ordered.  Would like to reevaluate in 3 months ? ?Easy bruising appears not to be pathologic which is reassuring.  We will continue to monitor ? ?No orders of the defined types were placed in this encounter. ? ?No orders of the defined types were placed in this encounter. ? ? ? ?Janora Norlander, DO ?[place] ?[phone] ? ?

## 2021-12-03 LAB — CMP14+EGFR
ALT: 9 IU/L (ref 0–32)
AST: 13 IU/L (ref 0–40)
Albumin/Globulin Ratio: 1.9 (ref 1.2–2.2)
Albumin: 4.2 g/dL (ref 3.8–4.8)
Alkaline Phosphatase: 70 IU/L (ref 44–121)
BUN/Creatinine Ratio: 9 (ref 9–23)
BUN: 9 mg/dL (ref 6–24)
Bilirubin Total: 0.8 mg/dL (ref 0.0–1.2)
CO2: 24 mmol/L (ref 20–29)
Calcium: 9.2 mg/dL (ref 8.7–10.2)
Chloride: 107 mmol/L — ABNORMAL HIGH (ref 96–106)
Creatinine, Ser: 0.95 mg/dL (ref 0.57–1.00)
Globulin, Total: 2.2 g/dL (ref 1.5–4.5)
Glucose: 84 mg/dL (ref 70–99)
Potassium: 3.8 mmol/L (ref 3.5–5.2)
Sodium: 142 mmol/L (ref 134–144)
Total Protein: 6.4 g/dL (ref 6.0–8.5)
eGFR: 76 mL/min/{1.73_m2} (ref >59)

## 2021-12-03 LAB — LIPID PANEL
Chol/HDL Ratio: 2.3 ratio (ref 0.0–4.4)
Cholesterol, Total: 104 mg/dL (ref 100–199)
HDL: 46 mg/dL (ref >39)
LDL Chol Calc (NIH): 44 mg/dL (ref 0–99)
Triglycerides: 61 mg/dL (ref 0–149)
VLDL Cholesterol Cal: 14 mg/dL (ref 5–40)

## 2021-12-04 ENCOUNTER — Telehealth: Payer: Self-pay | Admitting: Family Medicine

## 2021-12-04 NOTE — Telephone Encounter (Signed)
[employer] is processing your PA request and will respond shortly with next steps. You are currently using the fastest method to process this prior authorization. Please do not fax or call [employer] to resubmit this request. To check for an update later, open this request again from your dashboard. ?

## 2021-12-07 NOTE — Telephone Encounter (Signed)
[facility] [employer] ? received a request from your provider for coverage of Wegovy ?(semaglutide injection). ?As long as you remain covered by the [facility] [employer] and there are no changes to ?your plan benefits, this request is approved for the following time period: ?12/04/2021 - 07/06/2022 ? ? ?Pharm aware  ?

## 2022-01-10 ENCOUNTER — Encounter: Payer: Self-pay | Admitting: Family Medicine

## 2022-01-11 ENCOUNTER — Ambulatory Visit (INDEPENDENT_AMBULATORY_CARE_PROVIDER_SITE_OTHER): Payer: BC Managed Care – PPO | Admitting: Family Medicine

## 2022-01-11 DIAGNOSIS — U071 COVID-19: Secondary | ICD-10-CM

## 2022-01-11 MED ORDER — MOLNUPIRAVIR EUA 200MG CAPSULE: 4.0000 | ORAL_CAPSULE | Freq: Two times a day (BID) | ORAL | 0 refills | Status: AC

## 2022-01-11 NOTE — Telephone Encounter (Signed)
Please put this patient on my same day so I can prescribe meds

## 2022-01-11 NOTE — Progress Notes (Signed)
Telephone visit  Subjective: [id number] PCP: Raliegh Ip, DO OVZ:CHYIFOY S Koppenhaver is a 46 y.o. female calls for telephone consult today. Patient provides verbal consent for consult held via phone.  Due to COVID-19 pandemic this visit was conducted virtually. This visit type was conducted due to national recommendations for restrictions regarding the COVID-19 Pandemic (e.g. social distancing, sheltering in place) in an effort to limit this patient's exposure and mitigate transmission in our community. All issues noted in this document were discussed and addressed.  A physical exam was not performed with this format.   Location of patient: home Location of provider: [facility] Others present for call: none  1. COVID 19 Patient reports chest congestion, cough.  She has been using some of her spouse's cough medication.  Fever to 100.7F. Seems worse at night.  She reports myalgia.  She had some mild wheeze when she tried to tan yesterday.  She tested positive for COVID on Saturday.  Has been was sick with same recently.  ROS: Per HPI  No Known Allergies Past Medical History:  Diagnosis Date   GERD (gastroesophageal reflux disease)     Current Outpatient Medications:    esomeprazole (NEXIUM) 40 MG capsule, Take 1 capsule (40 mg total) by mouth daily., Disp: 30 capsule, Rfl: 3   fluconazole (DIFLUCAN) 150 MG tablet, Take 1 tablet every 3 days x3 doses.  Then start taking 1 tablet ONCE per week for 6 months. (Patient not taking: Reported on 12/02/2021), Disp: 30 tablet, Rfl: 0   imiquimod (ALDARA) 5 % cream, SMARTSIG:Topical Daily on Weekdays, Disp: , Rfl:    NIFEdipine (PROCARDIA) 10 MG capsule, Take 1 capsule (10 mg total) by mouth in the morning and at bedtime., Disp: 60 capsule, Rfl: 1   Semaglutide-Weight Management (WEGOVY) 0.5 MG/0.5ML SOAJ, Inject 0.5 mg into the skin every 7 (seven) days. Month #2, Disp: 2 mL, Rfl: 0   Semaglutide-Weight Management (WEGOVY) 1 MG/0.5ML SOAJ, Inject 1  mg into the skin every 7 (seven) days. Month #3, Disp: 2 mL, Rfl: 0   Semaglutide-Weight Management (WEGOVY) 1.7 MG/0.75ML SOAJ, Inject 1.7 mg into the skin every 7 (seven) days. Month#4, Disp: 3 mL, Rfl: 0  Assessment/ Plan: 46 y.o. female   COVID - Plan: molnupiravir EUA (LAGEVRIO) 200 mg CAPS capsule  Risk is obesity.  Molnupiravir sent to pharmacy.  Home care instructions reviewed with the patient and reasons for reevaluation discussed.  She was good understanding will follow-up as needed  Start time: 10:47a End time: 10:55a  Total time spent on patient care (including telephone call/ virtual visit): 8 minutes  Dennard Vezina Hulen Skains, DO [facility] [phone]

## 2022-01-30 ENCOUNTER — Other Ambulatory Visit: Payer: Self-pay | Admitting: Family Medicine

## 2022-01-30 DIAGNOSIS — K219 Gastro-esophageal reflux disease without esophagitis: Secondary | ICD-10-CM

## 2022-02-10 ENCOUNTER — Other Ambulatory Visit: Payer: Self-pay | Admitting: Family Medicine

## 2022-02-10 DIAGNOSIS — K219 Gastro-esophageal reflux disease without esophagitis: Secondary | ICD-10-CM

## 2022-02-10 DIAGNOSIS — E669 Obesity, unspecified: Secondary | ICD-10-CM

## 2022-03-12 ENCOUNTER — Ambulatory Visit: Payer: BC Managed Care – PPO | Admitting: Family Medicine

## 2022-03-12 ENCOUNTER — Encounter: Payer: Self-pay | Admitting: Family Medicine

## 2022-03-12 VITALS — BP 92/56 | HR 77 | Temp 97.1°F | Ht 65.0 in | Wt 180.4 lb

## 2022-03-12 DIAGNOSIS — B3731 Acute candidiasis of vulva and vagina: Secondary | ICD-10-CM

## 2022-03-12 DIAGNOSIS — E669 Obesity, unspecified: Secondary | ICD-10-CM | POA: Diagnosis not present

## 2022-03-12 MED ORDER — WEGOVY 2.4 MG/0.75ML ~~LOC~~ SOAJ
2.4000 mg | SUBCUTANEOUS | 3 refills | Status: DC
Start: 2022-03-12 — End: 2023-05-04

## 2022-03-12 NOTE — Progress Notes (Signed)
Subjective: [id number] recheck PCP: Raliegh Ip, DO [id number] S Binns is a 46 y.o. female presenting to clinic today for:  1. Obesity Patient here for 16-month follow-up on Wegovy.  She started around 207 pounds and is now down to 180 pounds.  She has been tolerating the titrations extremely well.  Initially she did have some sulfur like burps and diarrhea with certain foods.  That has since subsided because she is really figured out what foods trigger her.  She avoids overeating.  She and her husband often share meals now.  He is also treated with the same medicine.  She reports improvement in her vaginitis and is only having to take her fluconazole every other week now.  Not having any dizziness.  She is hydrating well.  She continues to work a very physically active job that are up to 10-hour days.   ROS: Per HPI  No Known Allergies Past Medical History:  Diagnosis Date   GERD (gastroesophageal reflux disease)     Current Outpatient Medications:    esomeprazole (NEXIUM) 40 MG capsule, TAKE 1 CAPSULE (40 MG TOTAL) BY MOUTH DAILY., Disp: 30 capsule, Rfl: 1   imiquimod (ALDARA) 5 % cream, SMARTSIG:Topical Daily on Weekdays, Disp: , Rfl:    NIFEdipine (PROCARDIA) 10 MG capsule, Take 1 capsule (10 mg total) by mouth in the morning and at bedtime., Disp: 60 capsule, Rfl: 1   Semaglutide-Weight Management (WEGOVY) 1.7 MG/0.75ML SOAJ, Inject 1.7 mg into the skin every 7 (seven) days. Month#4, Disp: 3 mL, Rfl: 0   fluconazole (DIFLUCAN) 150 MG tablet, Take 1 tablet every 3 days x3 doses.  Then start taking 1 tablet ONCE per week for 6 months. (Patient not taking: Reported on 12/02/2021), Disp: 30 tablet, Rfl: 0 Social History   Socioeconomic History   Marital status: Married    Spouse name: Not on file   Number of children: Not on file   Years of education: Not on file   Highest education level: Not on file  Occupational History   Not on file  Tobacco Use   Smoking status:  Never   Smokeless tobacco: Never  Vaping Use   Vaping Use: Never used  Substance and Sexual Activity   Alcohol use: No   Drug use: No   Sexual activity: Not on file  Other Topics Concern   Not on file  Social History Narrative   Not on file   Social Determinants of Health   Financial Resource Strain: Not on file  Food Insecurity: Not on file  Transportation Needs: Not on file  Physical Activity: Not on file  Stress: Not on file  Social Connections: Not on file  Intimate Partner Violence: Not on file   Family History  Problem Relation Age of Onset   Hypertension Mother    Cancer Father        Prostate   Hypertension Father     Objective: Office vital signs reviewed. BP (!) 92/56   Pulse 77   Temp (!) 97.1 F (36.2 C)   Ht 5\' 5"  (1.651 m)   Wt 180 lb 6.4 oz (81.8 kg)   SpO2 97%   BMI 30.02 kg/m   Physical Examination:  General: Awake, alert, well nourished, No acute distress HEENT: Sclera white.  Moist mucous membranes Cardio: regular rate and rhythm, S1S2 heard, no murmurs appreciated Pulm: clear to auscultation bilaterally, no wheezes, rhonchi or rales; normal work of breathing on room air  Assessment/ Plan: 46 y.o. female  Obesity (BMI 30.0-34.9) - Plan: Semaglutide-Weight Management (WEGOVY) 2.4 MG/0.75ML SOAJ  Recurrent candidiasis of vagina  She has had an excellent response to the [facility].  She has almost gotten her BMI out of obesity range.  Blood pressure is a little bit on the low side but she is always been low normal.  She is not having any concerning symptoms or signs from a blood pressure or dizziness standpoint.  I did encourage her to continue hydrating really well since she works a very physically active job and probably perspires a lot.  I have advanced her to 2.4 mg of Wegovy to start next month.   Additionally, seeming to have an improvement even in vaginitis, probably due to the reduce sugar intake etc.   We will reconvene again in 3  months, sooner if concerns arise  No orders of the defined types were placed in this encounter.  No orders of the defined types were placed in this encounter.    Raliegh Ip, DO [facility] [phone]

## 2022-04-08 ENCOUNTER — Other Ambulatory Visit: Payer: Self-pay | Admitting: Family Medicine

## 2022-04-08 DIAGNOSIS — K219 Gastro-esophageal reflux disease without esophagitis: Secondary | ICD-10-CM

## 2022-05-19 ENCOUNTER — Ambulatory Visit: Payer: BC Managed Care – PPO | Admitting: Family Medicine

## 2022-05-19 ENCOUNTER — Encounter: Payer: Self-pay | Admitting: Family Medicine

## 2022-05-19 VITALS — BP 107/68 | HR 88 | Temp 98.0°F | Resp 20 | Ht 65.0 in | Wt 170.0 lb

## 2022-05-19 DIAGNOSIS — Z23 Encounter for immunization: Secondary | ICD-10-CM

## 2022-05-19 DIAGNOSIS — B3731 Acute candidiasis of vulva and vagina: Secondary | ICD-10-CM

## 2022-05-19 DIAGNOSIS — N3001 Acute cystitis with hematuria: Secondary | ICD-10-CM

## 2022-05-19 DIAGNOSIS — R300 Dysuria: Secondary | ICD-10-CM | POA: Diagnosis not present

## 2022-05-19 LAB — URINALYSIS, ROUTINE W REFLEX MICROSCOPIC
Bilirubin, UA: NEGATIVE
Glucose, UA: NEGATIVE
Ketones, UA: NEGATIVE
Nitrite, UA: NEGATIVE
Protein,UA: NEGATIVE
Specific Gravity, UA: 1.02 (ref 1.005–1.030)
Urobilinogen, Ur: 1 mg/dL (ref 0.2–1.0)
pH, UA: 7 (ref 5.0–7.5)

## 2022-05-19 LAB — MICROSCOPIC EXAMINATION
Bacteria, UA: NONE SEEN
RBC, Urine: NONE SEEN /hpf (ref 0–2)
Renal Epithel, UA: NONE SEEN /hpf

## 2022-05-19 MED ORDER — FLUCONAZOLE 150 MG PO TABS
150.0000 mg | ORAL_TABLET | ORAL | 0 refills | Status: DC
Start: 2022-05-19 — End: 2022-06-15

## 2022-05-19 MED ORDER — NITROFURANTOIN MONOHYD MACRO 100 MG PO CAPS: 100.0000 mg | ORAL_CAPSULE | Freq: Two times a day (BID) | ORAL | 0 refills | Status: AC

## 2022-05-19 NOTE — Progress Notes (Addendum)
Assessment & Plan:  1. Acute cystitis with hematuria Education provided on UTIs. Encouraged adequate hydration.  - nitrofurantoin, macrocrystal-monohydrate, (MACROBID) 100 MG capsule; Take 1 capsule (100 mg total) by mouth 2 (two) times daily for 5 days.  Dispense: 10 capsule; Refill: 0  - Urine culture  2. Dysuria - Urinalysis, Routine w reflex microscopic - Urine dipstick shows positive for leukocytes, red blood cells.  3. Recurrent candidiasis of vagina - fluconazole (DIFLUCAN) 150 MG tablet; Take 1 tablet (150 mg total) by mouth once a week.  Dispense: 4 tablet; Refill: 0  4. Need for immunization against influenza - Flu Vaccine QUAD 27mo+IM (Fluarix, Fluzone & Alfiuria Quad PF)   Follow up plan: Return if symptoms worsen or fail to improve.  Deliah Boston, MSN, APRN, FNP-C [facility]  Subjective:   Patient ID: Sharon Foster, female    DOB: 1976/04/28, 46 y.o.   MRN: [id number]  HPI: Sharon Foster is a 46 y.o. female presenting on 05/19/2022 for Urinary Tract Infection (Itching and burning  - last week, now feels like urgency, pressure, burning )  Patient is accompanied by her husband, who she is okay with being present.  Patient complains of dysuria, urgency, and pressure when urinating . She has had symptoms for 1 week. Patient denies fever. Patient does not have a history of recurrent UTI.  Patient does not have a history of pyelonephritis. She has taken AZO and has been drinking cranberry juice.  At the onset of these symptoms she was experiencing vaginal itching, which resolved when she took a Diflucan. She is supposed to be taking Diflucan weekly, but had been stretching it to every other week.    ROS: Negative unless specifically indicated above in HPI.   Relevant past medical history reviewed and updated as indicated.   Allergies and medications reviewed and updated.   Current Outpatient Medications:    fluconazole (DIFLUCAN) 150  MG tablet, Take 1 tablet every 3 days x3 doses.  Then start taking 1 tablet ONCE per week for 6 months., Disp: 30 tablet, Rfl: 0   NIFEdipine (PROCARDIA) 10 MG capsule, Take 1 capsule (10 mg total) by mouth in the morning and at bedtime., Disp: 60 capsule, Rfl: 1   Semaglutide-Weight Management (WEGOVY) 2.4 MG/0.75ML SOAJ, Inject 2.4 mg into the skin every 7 (seven) days., Disp: 9 mL, Rfl: 3   esomeprazole (NEXIUM) 40 MG capsule, TAKE 1 CAPSULE (40 MG TOTAL) BY MOUTH DAILY. (Patient not taking: Reported on 05/19/2022), Disp: 30 capsule, Rfl: 4   imiquimod (ALDARA) 5 % cream, SMARTSIG:Topical Daily on Weekdays (Patient not taking: Reported on 05/19/2022), Disp: , Rfl:   No Known Allergies  Objective:   BP 107/68   Pulse 88   Temp 98 F (36.7 C)   Resp 20   Ht 5\' 5"  (1.651 m)   Wt 170 lb (77.1 kg)   SpO2 100%   BMI 28.29 kg/m    Physical Exam Vitals reviewed.  Constitutional:      General: She is not in acute distress.    Appearance: Normal appearance. She is not ill-appearing, toxic-appearing or diaphoretic.  HENT:     Head: Normocephalic and atraumatic.  Eyes:     General: No scleral icterus.       Right eye: No discharge.        Left eye: No discharge.     Conjunctiva/sclera: Conjunctivae normal.  Cardiovascular:     Rate and Rhythm: Normal rate.  Pulmonary:  Effort: Pulmonary effort is normal. No respiratory distress.  Abdominal:     Tenderness: There is no right CVA tenderness or left CVA tenderness.  Musculoskeletal:        General: Normal range of motion.     Cervical back: Normal range of motion.  Skin:    General: Skin is warm and dry.     Capillary Refill: Capillary refill takes less than 2 seconds.  Neurological:     General: No focal deficit present.     Mental Status: She is alert and oriented to person, place, and time. Mental status is at baseline.  Psychiatric:        Mood and Affect: Mood normal.        Behavior: Behavior normal.        Thought  Content: Thought content normal.        Judgment: Judgment normal.

## 2022-05-19 NOTE — Addendum Note (Signed)
Addended by: Loman Brooklyn on: 05/19/2022 02:37 PM   Modules accepted: Orders

## 2022-05-23 LAB — URINE CULTURE

## 2022-06-15 ENCOUNTER — Encounter: Payer: Self-pay | Admitting: Family Medicine

## 2022-06-15 ENCOUNTER — Ambulatory Visit: Payer: BC Managed Care – PPO | Admitting: Family Medicine

## 2022-06-15 VITALS — BP 92/56 | HR 81 | Temp 97.5°F | Ht 65.0 in | Wt 164.4 lb

## 2022-06-15 DIAGNOSIS — R233 Spontaneous ecchymoses: Secondary | ICD-10-CM | POA: Diagnosis not present

## 2022-06-15 DIAGNOSIS — E669 Obesity, unspecified: Secondary | ICD-10-CM

## 2022-06-15 DIAGNOSIS — B3731 Acute candidiasis of vulva and vagina: Secondary | ICD-10-CM | POA: Diagnosis not present

## 2022-06-15 MED ORDER — FLUCONAZOLE 150 MG PO TABS: 150.0000 mg | ORAL_TABLET | ORAL | 0 refills | Status: DC

## 2022-06-15 NOTE — Progress Notes (Signed)
Subjective: CC: f/u weight PCP: Janora Norlander, DO [id number] S Bueno is a 46 y.o. female presenting to clinic today for:  1.  Obesity Patient is utilizing the Wegovy 2.4 mg injection roughly every 10 to 14 days now.  She is nearing her goal of 160 pounds.  She denies any abdominal pain, nausea, vomiting.  Mains physically active and maintains a low-fat, low-carb diet.  She would like to have fasting labs collected today.  2.  Raynaud's phenomenon Patient is utilizing the Procardia 10 mg daily.  No dizziness, falls or loss of consciousness reported.  She does report some flushing after she takes the medication but otherwise doing well  3.  Recurrent yeast vaginitis She has been on Diflucan q. 7 days for about 6 months now.  She has not followed up with her OB/GYN recently.  Does not recall ever having had a culture of any of the yeast in the past but notes despite a 75-monthtreatment for recurrent yeast vaginitis, she continues to get recurrence.  This is independent of antibiotic use.  [place] [facility] [place]  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 (Patient not taking: Reported on 05/19/2022), Disp: 30 capsule, Rfl: 4   fluconazole (DIFLUCAN) 150 MG tablet, Take 1 tablet (150 mg total) by mouth once a week., Disp: 4 tablet, Rfl: 0 Social History   Socioeconomic History   Marital status: Married    Spouse name: Not on file    Number of children: Not on file   Years of education: Not on file   Highest education level: Not on file  Occupational History   Not on file  Tobacco Use   Smoking status: Never   Smokeless tobacco: Never  Vaping Use   Vaping Use: Never used  Substance and Sexual Activity   Alcohol use: No   Drug use: No   Sexual activity: Not on file  Other Topics Concern   Not on file  Social History Narrative   Not on file   Social Determinants of Health   Financial Resource Strain: Not on file  Food Insecurity: Not on file  Transportation Needs: Not on file  Physical Activity: Not on file  Stress: Not on file  Social Connections: Not on file  Intimate Partner Violence: Not on file   Family History  Problem Relation Age of Onset   Hypertension Mother    Cancer Father        Prostate   Hypertension Father     Objective: Office vital signs reviewed. BP (!) 92/56   Pulse 81   Temp (!) 97.5 F (36.4 C)   Ht '5\' 5"'  (1.651 m)   Wt 164 lb 6.4 oz (74.6 kg)   SpO2 98%   BMI 27.36 kg/m   Physical Examination:  General: Awake, alert, well nourished, No acute distress HEENT: Sclera white.  Moist mucous membranes Cardio: regular rate and  rhythm, S1S2 heard, no murmurs appreciated Pulm: clear to auscultation bilaterally, no wheezes, rhonchi or rales; normal work of breathing on room air  Assessment/ Plan: 46 y.o. female   Easy bruising - Plan: CBC  Obesity (BMI 30.0-34.9) - Plan: CMP14+EGFR, Lipid Panel  Recurrent candidiasis of vagina - Plan: fluconazole (DIFLUCAN) 150 MG tablet  Check CBC  BMI now in overweight range but she really physically looks good.  I think that she can likely start weaning from this [facility].  I encouraged her to start spacing it out further.  Add toning exercises.  For her recurrent candidiasis of the vagina.  We completed a 98-monthtreatment plan with the Diflucan and she still continues to get recurrent infections.  For this reason I would like her to  follow-up with her OB/GYN, I wonder if perhaps she is growing something that is not candidal in species?  May require vaginal culture during active infection.  Will copy this note to DJoie Bimlerat [facility] follow[facility]up  Orders Placed This Encounter  Procedures   CMP14+EGFR   Lipid Panel   CBC   Meds ordered this encounter  Medications   fluconazole (DIFLUCAN) 150 MG tablet    Sig: Take 1 tablet (150 mg total) by mouth once a week.    Dispense:  4 tablet    Refill:  [place]([phone]

## 2022-06-16 LAB — LIPID PANEL
Chol/HDL Ratio: 2.2 ratio (ref 0.0–4.4)
Cholesterol, Total: 87 mg/dL — ABNORMAL LOW (ref 100–199)
HDL: 40 mg/dL (ref >39)
LDL Chol Calc (NIH): 35 mg/dL (ref 0–99)
Triglycerides: 44 mg/dL (ref 0–149)
VLDL Cholesterol Cal: 12 mg/dL (ref 5–40)

## 2022-06-16 LAB — CBC
Hematocrit: 36.9 % (ref 34.0–46.6)
Hemoglobin: 12.4 g/dL (ref 11.1–15.9)
MCH: 31.8 pg (ref 26.6–33.0)
MCHC: 33.6 g/dL (ref 31.5–35.7)
MCV: 95 fL (ref 79–97)
Platelets: 157 10*3/uL (ref 150–450)
RBC: 3.9 x10E6/uL (ref 3.77–5.28)
RDW: 11.9 % (ref 11.7–15.4)
WBC: 5.5 10*3/uL (ref 3.4–10.8)

## 2022-06-16 LAB — CMP14+EGFR
ALT: 11 IU/L (ref 0–32)
AST: 17 IU/L (ref 0–40)
Albumin/Globulin Ratio: 1.8 (ref 1.2–2.2)
Albumin: 3.9 g/dL (ref 3.9–4.9)
Alkaline Phosphatase: 77 IU/L (ref 44–121)
BUN/Creatinine Ratio: 15 (ref 9–23)
BUN: 13 mg/dL (ref 6–24)
Bilirubin Total: 0.8 mg/dL (ref 0.0–1.2)
CO2: 23 mmol/L (ref 20–29)
Calcium: 8.9 mg/dL (ref 8.7–10.2)
Chloride: 104 mmol/L (ref 96–106)
Creatinine, Ser: 0.89 mg/dL (ref 0.57–1.00)
Globulin, Total: 2.2 g/dL (ref 1.5–4.5)
Glucose: 82 mg/dL (ref 70–99)
Potassium: 3.8 mmol/L (ref 3.5–5.2)
Sodium: 140 mmol/L (ref 134–144)
Total Protein: 6.1 g/dL (ref 6.0–8.5)
eGFR: 81 mL/min/{1.73_m2} (ref >59)

## 2022-08-04 ENCOUNTER — Ambulatory Visit: Payer: BC Managed Care – PPO | Admitting: Nurse Practitioner

## 2022-09-15 ENCOUNTER — Ambulatory Visit: Payer: BC Managed Care – PPO | Admitting: Family Medicine

## 2022-09-15 ENCOUNTER — Encounter: Payer: Self-pay | Admitting: Family Medicine

## 2022-09-15 VITALS — BP 102/60 | HR 66 | Temp 98.6°F | Ht 65.0 in | Wt 167.0 lb

## 2022-09-15 DIAGNOSIS — R232 Flushing: Secondary | ICD-10-CM

## 2022-09-15 DIAGNOSIS — R233 Spontaneous ecchymoses: Secondary | ICD-10-CM

## 2022-09-15 DIAGNOSIS — Z1211 Encounter for screening for malignant neoplasm of colon: Secondary | ICD-10-CM

## 2022-09-15 DIAGNOSIS — I7300 Raynaud's syndrome without gangrene: Secondary | ICD-10-CM

## 2022-09-15 DIAGNOSIS — R251 Tremor, unspecified: Secondary | ICD-10-CM | POA: Diagnosis not present

## 2022-09-15 DIAGNOSIS — B3731 Acute candidiasis of vulva and vagina: Secondary | ICD-10-CM | POA: Diagnosis not present

## 2022-09-16 LAB — ANA W/REFLEX IF POSITIVE
Anti JO-1: <0.2 AI (ref 0.0–0.9)
Anti Nuclear Antibody (ANA): POSITIVE — AB
Centromere Ab Screen: <0.2 AI (ref 0.0–0.9)
Chromatin Ab SerPl-aCnc: <0.2 AI (ref 0.0–0.9)
ENA RNP Ab: <0.2 AI (ref 0.0–0.9)
ENA SM Ab Ser-aCnc: <0.2 AI (ref 0.0–0.9)
ENA SSA (RO) Ab: 0.2 AI (ref 0.0–0.9)
ENA SSB (LA) Ab: <0.2 AI (ref 0.0–0.9)
Scleroderma (Scl-70) (ENA) Antibody, IgG: <0.2 AI (ref 0.0–0.9)
dsDNA Ab: 47 IU/mL — ABNORMAL HIGH (ref 0–9)

## 2022-09-16 LAB — IRON,TIBC AND FERRITIN PANEL
Ferritin: 144 ng/mL (ref 15–150)
Iron Saturation: 52 % (ref 15–55)
Iron: 116 ug/dL (ref 27–159)
Total Iron Binding Capacity: 224 ug/dL — ABNORMAL LOW (ref 250–450)
UIBC: 108 ug/dL — ABNORMAL LOW (ref 131–425)

## 2022-09-16 LAB — CMP14+EGFR
ALT: 21 IU/L (ref 0–32)
AST: 29 IU/L (ref 0–40)
Albumin/Globulin Ratio: 1.8 (ref 1.2–2.2)
Albumin: 4 g/dL (ref 3.9–4.9)
Alkaline Phosphatase: 80 IU/L (ref 44–121)
BUN/Creatinine Ratio: 12 (ref 9–23)
BUN: 11 mg/dL (ref 6–24)
Bilirubin Total: 0.9 mg/dL (ref 0.0–1.2)
CO2: 23 mmol/L (ref 20–29)
Calcium: 9.1 mg/dL (ref 8.7–10.2)
Chloride: 104 mmol/L (ref 96–106)
Creatinine, Ser: 0.92 mg/dL (ref 0.57–1.00)
Globulin, Total: 2.2 g/dL (ref 1.5–4.5)
Glucose: 77 mg/dL (ref 70–99)
Potassium: 3.5 mmol/L (ref 3.5–5.2)
Sodium: 141 mmol/L (ref 134–144)
Total Protein: 6.2 g/dL (ref 6.0–8.5)
eGFR: 78 mL/min/{1.73_m2} (ref >59)

## 2022-09-16 LAB — CBC
Hematocrit: 37.3 % (ref 34.0–46.6)
Hemoglobin: 12.7 g/dL (ref 11.1–15.9)
MCH: 32.9 pg (ref 26.6–33.0)
MCHC: 34 g/dL (ref 31.5–35.7)
MCV: 97 fL (ref 79–97)
Platelets: 166 10*3/uL (ref 150–450)
RBC: 3.86 x10E6/uL (ref 3.77–5.28)
RDW: 12 % (ref 11.7–15.4)
WBC: 6.5 10*3/uL (ref 3.4–10.8)

## 2022-09-16 LAB — T4, FREE: Free T4: 1.12 ng/dL (ref 0.82–1.77)

## 2022-09-16 LAB — FSH/LH
FSH: 5.7 m[IU]/mL
LH: 5.9 m[IU]/mL

## 2022-09-16 LAB — TSH: TSH: 1.74 u[IU]/mL (ref 0.450–4.500)

## 2022-09-17 ENCOUNTER — Other Ambulatory Visit: Payer: Self-pay | Admitting: Family Medicine

## 2022-09-17 DIAGNOSIS — R768 Other specified abnormal immunological findings in serum: Secondary | ICD-10-CM

## 2022-09-17 DIAGNOSIS — I7300 Raynaud's syndrome without gangrene: Secondary | ICD-10-CM

## 2022-09-17 NOTE — Progress Notes (Signed)
Orders Placed This Encounter  ?Procedures  ? Ambulatory referral to Rheumatology  ?  Referral Priority:   Routine  ?  Referral Type:   Consultation  ?  Referral Reason:   Specialty Services Required  ?  Requested Specialty:   Rheumatology  ?  Number of Visits Requested:   1  ? ? ?

## 2022-09-17 NOTE — Progress Notes (Signed)
Subjective: CC: Recurrent vaginitis PCP: Sharon Norlander, DO Sharon Foster is a 47 y.o. female presenting to clinic today for:  1.  Recurrent vaginitis Continues to have recurrent vaginitis.  She really only goes a couple of weeks without having vaginal symptoms.  Has not followed up with GYN recently but has an upcoming appointment.  They were considering swabbing her for other types of yeast infections  2.  Hand discoloration Continues to suffer from Raynaud's phenomenon.  Sometimes she has some pain and burning associated with this.  Cannot tolerate Procardia  3.  Hot flashes Continues to have hot flashes multiple times per week.   ROS: Per HPI  No Known Allergies Past Medical History:  Diagnosis Date   GERD (gastroesophageal reflux disease)     Current Outpatient Medications:    esomeprazole (NEXIUM) 40 MG capsule, TAKE 1 CAPSULE (40 MG TOTAL) BY MOUTH DAILY., Disp: 30 capsule, Rfl: 4   fluconazole (DIFLUCAN) 150 MG tablet, Take 1 tablet (150 mg total) by mouth once a week., Disp: 4 tablet, Rfl: 0   NIFEdipine (PROCARDIA) 10 MG capsule, Take 1 capsule (10 mg total) by mouth in the morning and at bedtime., Disp: 60 capsule, Rfl: 1   Semaglutide-Weight Management (WEGOVY) 2.4 MG/0.75ML SOAJ, Inject 2.4 mg into the skin every 7 (seven) days., Disp: 9 mL, Rfl: 3 Social History   Socioeconomic History   Marital status: Married    Spouse name: Not on file   Number of children: Not on file   Years of education: Not on file   Highest education level: Not on file  Occupational History   Not on file  Tobacco Use   Smoking status: Never   Smokeless tobacco: Never  Vaping Use   Vaping Use: Never used  Substance and Sexual Activity   Alcohol use: No   Drug use: No   Sexual activity: Not on file  Other Topics Concern   Not on file  Social History Narrative   Not on file   Social Determinants of Health   Financial Resource Strain: Not on file  Food  Insecurity: Not on file  Transportation Needs: Not on file  Physical Activity: Not on file  Stress: Not on file  Social Connections: Not on file  Intimate Partner Violence: Not on file   Family History  Problem Relation Age of Onset   Hypertension Mother    Cancer Father        Prostate   Hypertension Father     Objective: Office vital signs reviewed. BP 102/60   Pulse 66   Temp 98.6 F (37 C)   Ht 5\' 5"  (1.651 m)   Wt 167 lb (75.8 kg)   SpO2 95%   BMI 27.79 kg/m   Physical Examination:  General: Awake, alert, well nourished, No acute distress HEENT: No exophthalmos.  No goiter Cardio: regular rate and rhythm, S1S2 heard, no murmurs appreciated Pulm: clear to auscultation bilaterally, no wheezes, rhonchi or rales; normal work of breathing on room air Neuro: Fine tremor in the left hand  Assessment/ Plan: 47 y.o. female   Hot flashes - Plan: CMP14+EGFR, CBC, FSH/LH  Raynaud's disease without gangrene - Plan: ANA w/Reflex if Positive  Recurrent candidiasis of vagina - Plan: CBC  Tremor - Plan: TSH, T4, Free, CMP14+EGFR  Easy bruisability - Plan: CMP14+EGFR, CBC, Iron, TIBC and Ferritin Panel  Screen for colon cancer - Plan: Cologuard  Check FSH, LH, CBC, CMP and thyroid levels.  She did  have a visible fine tremor on the left hand today and given hot flashes, I think this is pertinent  She has had lifelong easy bruisability and I not sure that there is anything that we can do about this.  We have evaluated her platelet level in the past and there is not been anything unusual about it.  Will plan to check ANA however given ongoing Raynaud's symptoms and easy bruisability.  Perhaps she has an underlying autoimmune disorder?  Will defer to GYN for recurrent candidiasis.  Orders Placed This Encounter  Procedures   Cologuard   TSH   T4, Free   CMP14+EGFR   CBC   FSH/LH   ANA w/Reflex if Positive   Iron, TIBC and Ferritin Panel   No orders of the defined  types were placed in this encounter.    Sharon Norlander, DO [place] [phone]

## 2022-09-20 ENCOUNTER — Telehealth: Payer: Self-pay | Admitting: Family Medicine

## 2022-09-20 NOTE — Telephone Encounter (Signed)
Pt was made aware of results

## 2022-09-23 ENCOUNTER — Telehealth: Payer: Self-pay | Admitting: *Deleted

## 2022-09-23 NOTE — Telephone Encounter (Signed)
Sharon Foster (Key: [id number]) Rx #: [id number] CRFVOH 2.4MG /0.75ML auto-injectors  Sent to Plan

## 2022-09-24 NOTE — Telephone Encounter (Signed)
Patient Advocate Encounter  Prior Authorization for Mancel [employer]ons has been approved.    PA# # [place] [id number] Non-Grandfathered [id number]  Effective dates: 09/23/22 through 09/24/23   Placed a call to pharmacy to notify of the approval

## 2022-10-08 LAB — COLOGUARD: COLOGUARD: NEGATIVE

## 2022-12-26 ENCOUNTER — Encounter: Payer: Self-pay | Admitting: Family Medicine

## 2022-12-27 ENCOUNTER — Other Ambulatory Visit: Payer: Self-pay

## 2022-12-27 ENCOUNTER — Telehealth: Payer: BC Managed Care – PPO | Admitting: Family Medicine

## 2022-12-27 ENCOUNTER — Encounter: Payer: Self-pay | Admitting: Family Medicine

## 2022-12-27 DIAGNOSIS — B9689 Other specified bacterial agents as the cause of diseases classified elsewhere: Secondary | ICD-10-CM

## 2022-12-27 DIAGNOSIS — R300 Dysuria: Secondary | ICD-10-CM | POA: Diagnosis not present

## 2022-12-27 DIAGNOSIS — N898 Other specified noninflammatory disorders of vagina: Secondary | ICD-10-CM

## 2022-12-27 DIAGNOSIS — N760 Acute vaginitis: Secondary | ICD-10-CM | POA: Diagnosis not present

## 2022-12-27 DIAGNOSIS — B3731 Acute candidiasis of vulva and vagina: Secondary | ICD-10-CM | POA: Diagnosis not present

## 2022-12-27 LAB — TRICHOMONAS CULTURE

## 2022-12-27 LAB — URINALYSIS
Bilirubin, UA: NEGATIVE
Glucose, UA: NEGATIVE
Leukocytes,UA: NEGATIVE
Nitrite, UA: NEGATIVE
Protein,UA: NEGATIVE
RBC, UA: NEGATIVE
Specific Gravity, UA: >1.03 — ABNORMAL HIGH (ref 1.005–1.030)
Urobilinogen, Ur: 1 mg/dL (ref 0.2–1.0)
pH, UA: 5.5 (ref 5.0–7.5)

## 2022-12-27 LAB — WET PREP, GENITAL
Clue Cell Exam: POSITIVE — AB
Trichomonas Exam: NEGATIVE
Yeast Exam: NEGATIVE

## 2022-12-27 MED ORDER — METRONIDAZOLE 500 MG PO TABS
500.0000 mg | ORAL_TABLET | Freq: Two times a day (BID) | ORAL | 0 refills | Status: AC
Start: 2022-12-27 — End: 2023-01-03

## 2022-12-27 MED ORDER — FLUCONAZOLE 150 MG PO TABS
ORAL_TABLET | ORAL | 0 refills | Status: DC
Start: 2022-12-27 — End: 2023-08-09

## 2022-12-27 MED ORDER — METRONIDAZOLE 0.75 % VA GEL
1.0000 | Freq: Every day | VAGINAL | 1 refills | Status: AC | PRN
Start: 2022-12-27 — End: ?

## 2022-12-27 NOTE — Progress Notes (Signed)
MyChart Video visit  Subjective: CC: vaginitis PCP: Raliegh Ip, DO [id number] S Sharon Foster is a 47 y.o. female. Patient provides verbal consent for consult held via video.  Due to COVID-19 pandemic this visit was conducted virtually. This visit type was conducted due to national recommendations for restrictions regarding the COVID-19 Pandemic (e.g. social distancing, sheltering in place) in an effort to limit this patient's exposure and mitigate transmission in our community. All issues noted in this document were discussed and addressed.  A physical exam was not performed with this format.   Location of patient: home Location of provider: [facility] Others present for call: spouse  1. Vaginitis Patient reports some vaginal irritation and post coital bleeding recently.  This has been common when she has a vaginal infection. Has history of recurrent yeast vaginitis.  Reports being in a hot tub and wonders if this is contributing.  No fevers, nausea or vomiting reported   ROS: Per HPI  No Known Allergies Past Medical History:  Diagnosis Date   GERD (gastroesophageal reflux disease)     Current Outpatient Medications:    esomeprazole (NEXIUM) 40 MG capsule, TAKE 1 CAPSULE (40 MG TOTAL) BY MOUTH DAILY., Disp: 30 capsule, Rfl: 4   fluconazole (DIFLUCAN) 150 MG tablet, Take 1 tablet (150 mg total) by mouth once a week., Disp: 4 tablet, Rfl: 0   NIFEdipine (PROCARDIA) 10 MG capsule, Take 1 capsule (10 mg total) by mouth in the morning and at bedtime., Disp: 60 capsule, Rfl: 1   Semaglutide-Weight Management (WEGOVY) 2.4 MG/0.75ML SOAJ, Inject 2.4 mg into the skin every 7 (seven) days., Disp: 9 mL, Rfl: 3  Results for orders placed or performed in visit on 12/27/22 (from the past 24 hour(s))  Wet prep, genital     Status: Abnormal (Preliminary result)   Collection Time: 12/27/22  2:28 PM   Specimen: Vaginal Fluid   Vaginal Flui  Result Value Ref Range   Trichomonas Exam Negative  Negative   Yeast Exam Negative Negative   Clue Cell Exam Positive (A) Negative   Narrative   Performed at:  [facility] - [facility] [address] [place], [place]  [id number] Lab Director: Rockie Neighbours [facility], Phone:  [phone]  Trichomonas Culture     Status: None (Preliminary result)   Collection Time: 12/27/22  2:28 PM   Vaginal Flui  Result Value Ref Range   Trichomonas Culture WILL FOLLOW    Narrative   Performed at:  [address] - [facility] [address], [place], [place]  [id number] Lab Director: Jolene Schimke MD, Phone:  [phone]   Gen: well appearing female, NAD Pulm: normal WOB on room air  Assessment/ Plan: 47 y.o. female   BV (bacterial vaginosis) - Plan: Wet prep, genital, metroNIDAZOLE (FLAGYL) 500 MG tablet, metroNIDAZOLE (METROGEL) 0.75 % vaginal gel  Dysuria - Plan: Urine Culture  Recurrent candidiasis of vagina - Plan: fluconazole (DIFLUCAN) 150 MG tablet  BV on wet prep.  Start oral flagyl.  Metrogel vag prn intercourse for prophylaxis.  Diflucan for abx induced yeast vaginitis.  Plan for referral to Ob/gyn if persistent.   Start time: 4:21p End time: 4:33pm  Total time spent on patient care (including video visit/ documentation): 12 minutes  Shai Rasmussen Hulen Skains, DO [facility] [phone]

## 2022-12-29 LAB — URINE CULTURE

## 2023-01-18 ENCOUNTER — Ambulatory Visit: Payer: BC Managed Care – PPO | Admitting: Family Medicine

## 2023-01-18 VITALS — BP 101/57 | HR 67 | Temp 98.5°F | Ht 65.0 in | Wt 167.0 lb

## 2023-01-18 DIAGNOSIS — N761 Subacute and chronic vaginitis: Secondary | ICD-10-CM

## 2023-01-18 DIAGNOSIS — N949 Unspecified condition associated with female genital organs and menstrual cycle: Secondary | ICD-10-CM | POA: Diagnosis not present

## 2023-01-18 DIAGNOSIS — E663 Overweight: Secondary | ICD-10-CM | POA: Diagnosis not present

## 2023-01-18 LAB — MICROSCOPIC EXAMINATION

## 2023-01-18 LAB — URINALYSIS, ROUTINE W REFLEX MICROSCOPIC
Bilirubin, UA: NEGATIVE
Glucose, UA: NEGATIVE
Ketones, UA: NEGATIVE
Leukocytes,UA: NEGATIVE
Nitrite, UA: NEGATIVE
Protein,UA: NEGATIVE
Specific Gravity, UA: >1.03 — ABNORMAL HIGH (ref 1.005–1.030)
Urobilinogen, Ur: 0.2 mg/dL (ref 0.2–1.0)
pH, UA: 5.5 (ref 5.0–7.5)

## 2023-01-18 LAB — WET PREP, GENITAL
Trichomonas Exam: NEGATIVE
Yeast Exam: NEGATIVE

## 2023-01-18 NOTE — Progress Notes (Signed)
Subjective: CC: Vaginitis PCP: Raliegh Ip, DO ZOX:[id number] Sharon Foster is a 47 y.o. female presenting to clinic today for:  1.  Vaginitis Patient was seen via video visit on 12/27/2022 for presumed bacterial vaginitis that she was having a watery discharge with odor.  She was also given Diflucan for what we suspected may be recurrent candidiasis of the vagina.  She notes while the odor and yeasty discharge have resolved she continues to have a thin watery discharge and ongoing vaginal irritation such that she had some spotting vaginally.  This again seems to be related to intercourse.  She remains sexually active with her husband, who has no symptoms.  She has tried multiple lubricants including coconut oil, Astroglide (which is what she is currently using)  2.  Overweight Patient is no longer using Wegovy.  She seems to be maintaining her weight well but wanted to talk about strategies to continue maintaining weight going forward.  Her insurance no longer covers [place].     ROS: Per HPI  No Known Allergies Past Medical History:  Diagnosis Date   GERD (gastroesophageal reflux disease)     Current Outpatient Medications:    esomeprazole (NEXIUM) 40 MG capsule, TAKE 1 CAPSULE (40 MG TOTAL) BY MOUTH DAILY., Disp: 30 capsule, Rfl: 4   fluconazole (DIFLUCAN) 150 MG tablet, Take 1 tablet now.  Then repeat in 3 days if needed for ongoing symptoms., Disp: 2 tablet, Rfl: 0   metroNIDAZOLE (METROGEL) 0.75 % vaginal gel, Place 1 Applicatorful vaginally daily as needed (intercourse)., Disp: 70 g, Rfl: 1   NIFEdipine (PROCARDIA) 10 MG capsule, Take 1 capsule (10 mg total) by mouth in the morning and at bedtime., Disp: 60 capsule, Rfl: 1   Semaglutide-Weight Management (WEGOVY) 2.4 MG/0.75ML SOAJ, Inject 2.4 mg into the skin every 7 (seven) days., Disp: 9 mL, Rfl: 3 Social History   Socioeconomic History   Marital status: Married    Spouse name: Not on file   Number of children: Not on file    Years of education: Not on file   Highest education level: GED or equivalent  Occupational History   Not on file  Tobacco Use   Smoking status: Never   Smokeless tobacco: Never  Vaping Use   Vaping Use: Never used  Substance and Sexual Activity   Alcohol use: No   Drug use: No   Sexual activity: Not on file  Other Topics Concern   Not on file  Social History Narrative   Not on file   Social Determinants of Health   Financial Resource Strain: Low Risk  (01/18/2023)   Overall Financial Resource Strain (CARDIA)    Difficulty of Paying Living Expenses: Not hard at all  Food Insecurity: No Food Insecurity (01/18/2023)   Hunger Vital Sign    Worried About Running Out of Food in the Last Year: Never true    Ran Out of Food in the Last Year: Never true  Transportation Needs: No Transportation Needs (01/18/2023)   PRAPARE - Administrator, Civil Service (Medical): No    Lack of Transportation (Non-Medical): No  Physical Activity: Sufficiently Active (01/18/2023)   Exercise Vital Sign    Days of Exercise per Week: 5 days    Minutes of Exercise per Session: 60 min  Stress: No Stress Concern Present (01/18/2023)   [employer] of Occupational Health - Occupational Stress Questionnaire    Feeling of Stress : Not at all  Social Connections: Unknown (01/18/2023)  Social Connection and Isolation Panel [NHANES]    Frequency of Communication with Friends and Family: Patient declined    Frequency of Social Gatherings with Friends and Family: Three times a week    Attends Religious Services: Patient declined    Active Member of Clubs or Organizations: Patient declined    Attends Banker Meetings: Not on file    Marital Status: Married  Catering manager Violence: Not on file   Family History  Problem Relation Age of Onset   Hypertension Mother    Cancer Father        Prostate   Hypertension Father     Objective: Office vital signs reviewed. BP (!)  101/57   Pulse 67   Temp 98.5 F (36.9 C)   Ht 5\' 5"  (1.651 m)   Wt 167 lb (75.8 kg)   SpO2 94%   BMI 27.79 kg/m   Physical Examination:  General: Awake, alert, well nourished, mildly overweight female. No acute distress HEENT: Sclera white moist mucous membranes GI: soft, NT/ND GU: No suprapubic tenderness palpation.  No adnexal masses or tenderness.  Assessment/ Plan: 47 y.o. female   Subacute vaginitis - Plan: Wet prep, genital, Urine Culture, Urinalysis, Routine w reflex microscopic  Vaginal burning - Plan: Wet prep, genital, Urine Culture, Urinalysis, Routine w reflex microscopic  Overweight (BMI 25.0-29.9)  Ongoing vaginitis but no evidence of infection.  She had scant bacteria noted on both UA and on vaginal swab with scant white blood cells on the vaginal swab.  I do question if perhaps the lubricant that she is using is causing a reactive chemical irritation.  I did recommend that she follow-up with GYN to ensure that we do not have any other types of infections that are not identifiable on routine testing.  We discussed using natural lubricants and scent free/chemical free lubricants.  She seems to be doing well after Wegovy weight loss.  So far maintaining.  We discussed that if she starts de-escalating for any reason we can consider referral to [facility] drug for their weight loss program.  Information given to her today.  Will plan to see her back in 6 to 12 months, sooner if concerns arise  Orders Placed This Encounter  Procedures   Wet prep, genital   Urine Culture   Urinalysis, Routine w reflex microscopic   No orders of the defined types were placed in this encounter.    Raliegh Ip, DO [facility] [phone]

## 2023-01-18 NOTE — Patient Instructions (Addendum)
[facility] has medication if you decide you want to go back to it  Vaginitis  Vaginitis is a condition in which the vaginal tissue swells and becomes irritated. This condition is most often caused by a change in the normal balance of bacteria and yeast that live in the vagina. This change causes an overgrowth of certain bacteria or yeast, which causes the inflammation. There are different types of vaginitis. What are the causes? The cause of this condition depends on the type of vaginitis. It can be caused by: Bacteria (bacterial vaginosis). Yeast, which is a fungus (candidiasis). A parasite (trichomoniasis vaginitis). A virus (viral vaginitis). Low hormone levels (atrophic vaginitis). Low hormone levels can occur during pregnancy, breastfeeding, or after menopause. Irritants, such as bubble baths, scented tampons, and feminine sprays (allergic vaginitis). Other factors can change the normal balance of the yeast and bacteria that live in the vagina. These include: Antibiotic medicines. Poor hygiene. Diaphragms, vaginal sponges, spermicides, birth control pills, and intrauterine devices (IUDs). Sex. Infection. Uncontrolled diabetes. A weakened body defense system (immune system). What increases the risk? This condition is more likely to develop in women who: Smoke or are exposed to secondhand smoke. Use vaginal douches, scented tampons, or scented sanitary pads. Wear tight-fitting pants or thong underwear. Use oral birth control pills or an IUD. Have sex without a condom or have multiple partners. Have an STI. Frequently use the spermicide nonoxynol-9. Eat lots of foods high in sugar or who have uncontrolled diabetes. Have low estrogen levels. Have a weakened immune system from an immune disorder or medical treatment. Are pregnant or breastfeeding. What are the signs or symptoms? Symptoms vary depending on the cause of the vaginitis. Common symptoms include: Abnormal vaginal  discharge. The discharge is white, gray, or yellow with bacterial vaginosis. The discharge is thick, white, and cheesy with a yeast infection. The discharge is frothy and yellow or greenish with trichomoniasis. A bad vaginal smell. The smell is fishy with bacterial vaginosis. Vaginal itching, pain, or swelling. Pain with sex. Pain or burning when urinating. Sometimes there are no symptoms. How is this diagnosed? This condition is diagnosed based on your symptoms and medical history. A physical exam, including a pelvic exam, will also be done. You may also have other tests, including: Tests to determine the pH level (acidity or alkalinity) of your vagina. A whiff test to assess the odor that results when a sample of your vaginal discharge is mixed with a potassium hydroxide solution. Tests of vaginal fluid. A sample will be examined under a microscope. How is this treated? Treatment varies depending on the type of vaginitis you have. Your treatment may include: Antibiotic creams or pills to treat bacterial vaginosis and trichomoniasis. Antifungal medicines, such as vaginal creams or suppositories, to treat a yeast infection. Medicine to ease discomfort if you have viral vaginitis. Your sexual partner should also be treated. Estrogen delivered in a cream, pill, suppository, or vaginal ring to treat atrophic vaginitis. If vaginal dryness occurs, lubricants and moisturizing creams may help. You may need to avoid scented soaps, sprays, or douches. Stopping use of a product that is causing allergic vaginitis and then using a vaginal cream to treat the symptoms. Follow these instructions at home: Lifestyle Keep your genital area clean and dry. Avoid soap, and only rinse the area with water. Do not douche or use tampons until your health care provider says it is okay. Use sanitary pads, if needed. Do not have sex until your health care provider approves.  When you can return to sex, practice safe sex  and use condoms. Wipe from front to back. This avoids the spread of bacteria from the rectum to the vagina. General instructions Take over-the-counter and prescription medicines only as told by your health care provider. If you were prescribed an antibiotic medicine, take or use it as told by your health care provider. Do not stop taking or using the antibiotic even if you start to feel better. Keep all follow-up visits. This is important. How is this prevented? Use mild, unscented products. Do not use things that can irritate the vagina, such as fabric softeners. Avoid the following products if they are scented: Feminine sprays. Detergents. Tampons. Feminine hygiene products. Soaps or bubble baths. Let air reach your genital area. To do this: Wear cotton underwear to reduce moisture buildup. Avoid wearing underwear while you sleep. Avoid wearing tight pants and underwear or nylons without a cotton panel. Avoid wearing thong underwear. Take off any wet clothing, such as bathing suits, as soon as possible. Practice safe sex and use condoms. Contact a health care provider if: You have abdominal or pelvic pain. You have a fever or chills. You have symptoms that last for more than 2-3 days. Get help right away if: You have a fever and your symptoms suddenly get worse. Summary Vaginitis is a condition in which the vaginal tissue becomes inflamed.This condition is most often caused by a change in the normal balance of bacteria and yeast that live in the vagina. Treatment varies depending on the type of vaginitis you have. Do not douche, use tampons, or have sex until your health care provider approves. When you can return to sex, practice safe sex and use condoms. This information is not intended to replace advice given to you by your health care provider. Make sure you discuss any questions you have with your health care provider. Document Revised: 02/07/2020 Document Reviewed:  02/07/2020 Elsevier Patient Education  2024 [employer].

## 2023-01-19 LAB — URINE CULTURE

## 2023-03-22 ENCOUNTER — Encounter: Payer: Self-pay | Admitting: Family Medicine

## 2023-03-30 ENCOUNTER — Encounter: Payer: Self-pay | Admitting: Family Medicine

## 2023-04-24 ENCOUNTER — Other Ambulatory Visit: Payer: Self-pay | Admitting: Family Medicine

## 2023-04-24 DIAGNOSIS — K219 Gastro-esophageal reflux disease without esophagitis: Secondary | ICD-10-CM

## 2023-05-04 ENCOUNTER — Telehealth: Payer: Self-pay | Admitting: Family Medicine

## 2023-05-04 ENCOUNTER — Ambulatory Visit: Payer: BC Managed Care – PPO | Admitting: Family Medicine

## 2023-05-04 ENCOUNTER — Encounter: Payer: Self-pay | Admitting: Family Medicine

## 2023-05-04 VITALS — BP 103/63 | HR 66 | Temp 98.6°F | Ht 65.0 in | Wt 181.0 lb

## 2023-05-04 DIAGNOSIS — Z713 Dietary counseling and surveillance: Secondary | ICD-10-CM

## 2023-05-04 DIAGNOSIS — J302 Other seasonal allergic rhinitis: Secondary | ICD-10-CM

## 2023-05-04 DIAGNOSIS — E6609 Other obesity due to excess calories: Secondary | ICD-10-CM

## 2023-05-04 DIAGNOSIS — Z6830 Body mass index (BMI) 30.0-30.9, adult: Secondary | ICD-10-CM

## 2023-05-04 DIAGNOSIS — Z23 Encounter for immunization: Secondary | ICD-10-CM

## 2023-05-04 MED ORDER — PHENTERMINE HCL 37.5 MG PO TABS
18.7500 mg | ORAL_TABLET | Freq: Every day | ORAL | 0 refills | Status: AC
Start: 2023-05-04 — End: ?

## 2023-05-04 MED ORDER — TRIAMCINOLONE ACETONIDE 55 MCG/ACT NA AERO
2.0000 | INHALATION_SPRAY | Freq: Every day | NASAL | Status: AC
Start: 2023-05-04 — End: ?

## 2023-05-04 MED ORDER — FEXOFENADINE HCL 180 MG PO TABS
180.0000 mg | ORAL_TABLET | Freq: Every day | ORAL | Status: AC
Start: 2023-05-04 — End: ?

## 2023-05-04 NOTE — Progress Notes (Signed)
Subjective: CC: Weight loss counseling PCP: Raliegh Ip, DO ZOX:WRUEAVW Sharon Foster is a 47 y.o. female presenting to clinic today for:  1.  Weight loss counseling She reports that she has been emotionally eating and boredom eating a bit more.  She subsequently has gained weight.  She did excellent on the GLP but notes that when she started spacing out the high dose to monthly it really made her sick so she was hesitant to revisit GLP.  She has been successful with phentermine in the past and would like to visit that if possible.  Typically only needed half a tablet daily and maybe every other day to maintain satiety.  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   ROS: Per HPI  No Known Allergies Past Medical History:  Diagnosis Date   GERD (gastroesophageal reflux disease)     Current Outpatient Medications:    esomeprazole (NEXIUM) 40 MG capsule, TAKE 1 CAPSULE (40 MG TOTAL) BY MOUTH DAILY., Disp: 30 capsule, Rfl: 0   fluconazole (DIFLUCAN) 150 MG tablet, Take 1 tablet now.  Then repeat in 3 days if needed for ongoing symptoms., Disp: 2 tablet, Rfl: 0   metroNIDAZOLE (METROGEL) 0.75 % vaginal gel, Place 1 Applicatorful vaginally daily as needed (intercourse)., Disp: 70 g, Rfl: 1   NIFEdipine (PROCARDIA) 10 MG capsule, Take 1 capsule (10 mg total) by mouth in the morning and at bedtime., Disp: 60 capsule, Rfl: 1 Social History   Socioeconomic History   Marital status: Married    Spouse name: Not on file   Number of children: Not on file   Years of education: Not on file   Highest education level: GED or equivalent  Occupational History   Not on file  Tobacco Use   Smoking status: Never   Smokeless tobacco: Never  Vaping Use   Vaping status: Never Used  Substance and Sexual Activity   Alcohol use: No   Drug use: No   Sexual activity: Not on file  Other Topics Concern   Not on file  Social History Narrative   Not on file   Social  Determinants of Health   Financial Resource Strain: Low Risk  (01/18/2023)   Overall Financial Resource Strain (CARDIA)    Difficulty of Paying Living Expenses: Not hard at all  Food Insecurity: No Food Insecurity (01/18/2023)   Hunger Vital Sign    Worried About Running Out of Food in the Last Year: Never true    Ran Out of Food in the Last Year: Never true  Transportation Needs: No Transportation Needs (01/18/2023)   PRAPARE - Administrator, Civil Service (Medical): No    Lack of Transportation (Non-Medical): No  Physical Activity: Sufficiently Active (01/18/2023)   Exercise Vital Sign    Days of Exercise per Week: 5 days    Minutes of Exercise per Session: 60 min  Stress: No Stress Concern Present (01/18/2023)   [employer] of Occupational Health - Occupational Stress Questionnaire    Feeling of Stress : Not at all  Social Connections: Unknown (01/18/2023)   Social Connection and Isolation Panel [NHANES]    Frequency of Communication with Friends and Family: Patient declined    Frequency of Social Gatherings with Friends and Family: Three times a week    Attends Religious Services: Patient declined    Active Member of Clubs or Organizations: Patient declined    Attends Banker Meetings: Not on file    Marital  Status: Married  Catering manager Violence: Unknown (11/25/2021)   Received from [facility], [facility]   HITS    Physically Hurt: Not on file    Insult or Talk Down To: Not on file    Threaten Physical Harm: Not on file    Scream or Curse: Not on file   Family History  Problem Relation Age of Onset   Hypertension Mother    Cancer Father        Prostate   Hypertension Father     Objective: Office vital signs reviewed. BP 103/63   Pulse 66   Temp 98.6 F (37 C)   Ht 5\' 5"  (1.651 m)   Wt 181 lb (82.1 kg)   SpO2 100%   BMI 30.12 kg/m   Physical Examination:  General: Awake, alert, well nourished, No acute distress HEENT:  Sclera white.  Moist mucous membranes. Cardio: regular rate and rhythm, S1S2 heard, no murmurs appreciated Pulm: clear to auscultation bilaterally, no wheezes, rhonchi or rales; normal work of breathing on room air PSych: Anxious when talking about her son  Assessment/ Plan: 47 y.o. female   Class 1 obesity due to excess calories without serious comorbidity with body mass index (BMI) of 30.0 to 30.9 in adult - Plan: phentermine (ADIPEX-P) 37.5 MG tablet  Weight loss counseling, encounter for - Plan: phentermine (ADIPEX-P) 37.5 MG tablet  Encounter for immunization - Plan: Flu vaccine trivalent PF, 6mos and older(Flulaval,Afluria,Fluarix,Fluzone)  Seasonal allergic rhinitis, unspecified trigger - Plan: fexofenadine (ALLEGRA ALLERGY) 180 MG tablet, triamcinolone (NASACORT) 55 MCG/ACT AERO nasal inhaler  Adipex ordered.  We discussed utilization for up to 6 weeks.  Offered Wellbutrin for emotional eating.  Could consider compounding GIP should she desire going back to that.  National narcotic database reviewed and there were no red flags  Influenza vaccination administered  I gave her samples of Allegra and Nasacort and demonstrated how to utilize the Nasacort in office today.  She will follow-up as needed this issue   Raliegh Ip, DO [facility] [phone]

## 2023-05-04 NOTE — Patient Instructions (Signed)
If you decide you want to do wellbutrin for emotional eating, let me know.

## 2023-05-29 ENCOUNTER — Other Ambulatory Visit: Payer: Self-pay | Admitting: Family Medicine

## 2023-05-29 DIAGNOSIS — K219 Gastro-esophageal reflux disease without esophagitis: Secondary | ICD-10-CM

## 2023-07-25 ENCOUNTER — Encounter: Payer: Self-pay | Admitting: Family Medicine

## 2023-07-25 ENCOUNTER — Ambulatory Visit: Payer: BC Managed Care – PPO | Admitting: Family Medicine

## 2023-07-25 VITALS — BP 99/63 | HR 76 | Temp 98.4°F | Ht 65.0 in | Wt 182.0 lb

## 2023-07-25 DIAGNOSIS — E6609 Other obesity due to excess calories: Secondary | ICD-10-CM | POA: Diagnosis not present

## 2023-07-25 DIAGNOSIS — E66811 Obesity, class 1: Secondary | ICD-10-CM

## 2023-07-25 DIAGNOSIS — Z6830 Body mass index (BMI) 30.0-30.9, adult: Secondary | ICD-10-CM

## 2023-07-25 DIAGNOSIS — Z713 Dietary counseling and surveillance: Secondary | ICD-10-CM

## 2023-07-25 NOTE — Progress Notes (Signed)
Subjective: CC: Follow-up weight PCP: Raliegh Ip, DO NWG:[id number] Sharon Foster is a 47 y.o. female presenting to clinic today for:  1.  Obesity Patient utilized Adipex as needed appetite suppression.  She does not report any increased blood pressures, heart palpitations etc.  She has been maintaining weight at below 180 pretty consistently.  Has been trying to stay physically active in mind her diet   ROS: Per HPI  No Known Allergies Past Medical History:  Diagnosis Date   GERD (gastroesophageal reflux disease)     Current Outpatient Medications:    esomeprazole (NEXIUM) 40 MG capsule, TAKE 1 CAPSULE (40 MG TOTAL) BY MOUTH DAILY., Disp: 90 capsule, Rfl: 0   fexofenadine (ALLEGRA ALLERGY) 180 MG tablet, Take 1 tablet (180 mg total) by mouth daily., Disp: , Rfl:    fluconazole (DIFLUCAN) 150 MG tablet, Take 1 tablet now.  Then repeat in 3 days if needed for ongoing symptoms., Disp: 2 tablet, Rfl: 0   metroNIDAZOLE (METROGEL) 0.75 % vaginal gel, Place 1 Applicatorful vaginally daily as needed (intercourse)., Disp: 70 g, Rfl: 1   NIFEdipine (PROCARDIA) 10 MG capsule, Take 1 capsule (10 mg total) by mouth in the morning and at bedtime., Disp: 60 capsule, Rfl: 1   phentermine (ADIPEX-P) 37.5 MG tablet, Take 0.5-1 tablets (18.75-37.5 mg total) by mouth daily before breakfast. X1 month.  Then take 1 tablet every other day until gone., Disp: 45 tablet, Rfl: 0   triamcinolone (NASACORT) 55 MCG/ACT AERO nasal inhaler, Place 2 sprays into the nose daily., Disp: , Rfl:  Social History   Socioeconomic History   Marital status: Married    Spouse name: Not on file   Number of children: Not on file   Years of education: Not on file   Highest education level: GED or equivalent  Occupational History   Not on file  Tobacco Use   Smoking status: Never   Smokeless tobacco: Never  Vaping Use   Vaping status: Never Used  Substance and Sexual Activity   Alcohol use: No   Drug use: No    Sexual activity: Not on file  Other Topics Concern   Not on file  Social History Narrative   Not on file   Social Determinants of Health   Financial Resource Strain: Low Risk  (01/18/2023)   Overall Financial Resource Strain (CARDIA)    Difficulty of Paying Living Expenses: Not hard at all  Food Insecurity: No Food Insecurity (01/18/2023)   Hunger Vital Sign    Worried About Running Out of Food in the Last Year: Never true    Ran Out of Food in the Last Year: Never true  Transportation Needs: No Transportation Needs (01/18/2023)   PRAPARE - Administrator, Civil Service (Medical): No    Lack of Transportation (Non-Medical): No  Physical Activity: Sufficiently Active (01/18/2023)   Exercise Vital Sign    Days of Exercise per Week: 5 days    Minutes of Exercise per Session: 60 min  Stress: No Stress Concern Present (01/18/2023)   [employer] of Occupational Health - Occupational Stress Questionnaire    Feeling of Stress : Not at all  Social Connections: Unknown (01/18/2023)   Social Connection and Isolation Panel [NHANES]    Frequency of Communication with Friends and Family: Patient declined    Frequency of Social Gatherings with Friends and Family: Three times a week    Attends Religious Services: Patient declined    Active Member of Clubs or  Organizations: Patient declined    Attends Engineer, structural: Not on file    Marital Status: Married  Intimate Partner Violence: Unknown (11/25/2021)   Received from [facility]   HITS    Physically Hurt: Not on file    Insult or Talk Down To: Not on file    Threaten Physical Harm: Not on file    Scream or Curse: Not on file   Family History  Problem Relation Age of Onset   Hypertension Mother    Cancer Father        Prostate   Hypertension Father     Objective: Office vital signs reviewed. BP 99/63   Pulse 76   Temp 98.4 F (36.9 C)   Ht 5\' 5"  (1.651 m)   Wt 182 lb (82.6 kg)   SpO2  97%   BMI 30.29 kg/m   Physical Examination:  General: Awake, alert, obese, No acute distress HEENT: Sclera white.  Moist mucous membranes Cardio: regular rate and rhythm  Pulm:  normal work of breathing on room air    Assessment/ Plan: 47 y.o. female   Class 1 obesity due to excess calories without serious comorbidity with body mass index (BMI) of 30.0 to 30.9 in adult - Plan: Lipid Panel  Weight loss counseling, encounter for - Plan: Lipid Panel  Fasting lipid panel collected today.  We discussed compounded GLP versus GIP.  She is going to see what her formulary covers in January as they are switching over to [place] and we can consider reinitiation of one of the injectables as the phentermine is not a good long-term solution to weight management for this patient   Raliegh Ip, DO [facility] [phone]

## 2023-07-26 LAB — LIPID PANEL
Chol/HDL Ratio: 2.1 {ratio} (ref 0.0–4.4)
Cholesterol, Total: 120 mg/dL (ref 100–199)
HDL: 56 mg/dL (ref >39)
LDL Chol Calc (NIH): 51 mg/dL (ref 0–99)
Triglycerides: 57 mg/dL (ref 0–149)
VLDL Cholesterol Cal: 13 mg/dL (ref 5–40)

## 2023-08-08 ENCOUNTER — Encounter: Payer: Self-pay | Admitting: Family Medicine

## 2023-08-09 ENCOUNTER — Encounter: Payer: Self-pay | Admitting: Family Medicine

## 2023-08-09 ENCOUNTER — Ambulatory Visit: Payer: BC Managed Care – PPO | Admitting: Family Medicine

## 2023-08-09 VITALS — BP 115/69 | HR 74 | Temp 98.2°F | Ht 65.0 in | Wt 184.2 lb

## 2023-08-09 DIAGNOSIS — J069 Acute upper respiratory infection, unspecified: Secondary | ICD-10-CM | POA: Diagnosis not present

## 2023-08-09 DIAGNOSIS — N760 Acute vaginitis: Secondary | ICD-10-CM

## 2023-08-09 DIAGNOSIS — Z8742 Personal history of other diseases of the female genital tract: Secondary | ICD-10-CM

## 2023-08-09 DIAGNOSIS — N898 Other specified noninflammatory disorders of vagina: Secondary | ICD-10-CM

## 2023-08-09 DIAGNOSIS — B9689 Other specified bacterial agents as the cause of diseases classified elsewhere: Secondary | ICD-10-CM

## 2023-08-09 LAB — WET PREP FOR TRICH, YEAST, CLUE
Clue Cell Exam: POSITIVE — AB
Trichomonas Exam: NEGATIVE
Yeast Exam: NEGATIVE

## 2023-08-09 MED ORDER — FLUCONAZOLE 150 MG PO TABS: 150.0000 mg | ORAL_TABLET | Freq: Once | ORAL | 0 refills | Status: AC

## 2023-08-09 MED ORDER — METRONIDAZOLE 500 MG PO TABS: 500.0000 mg | ORAL_TABLET | Freq: Two times a day (BID) | ORAL | 0 refills | Status: DC

## 2023-08-09 MED ORDER — CHLORPHEN-PE-ACETAMINOPHEN 4-10-325 MG PO TABS: 1.0000 | ORAL_TABLET | Freq: Four times a day (QID) | ORAL | 0 refills | Status: DC | PRN

## 2023-08-09 NOTE — Progress Notes (Signed)
Subjective:  Patient ID: Sharon Foster, female    DOB: 12/23/1975, 47 y.o.   MRN: [id number]  Patient Care Team: Raliegh Ip, DO as PCP - General (Family Medicine)   Chief Complaint:  vaginal odor (X 3 days ), Vaginal Discharge (X 1 week ), Nasal Congestion (X 3 days.  Patient states that she also feels ear pressure in her left ear ), and Sore Throat (X 3 days )   HPI: Sharon Foster is a 47 y.o. female presenting on 08/09/2023 for vaginal odor (X 3 days ), Vaginal Discharge (X 1 week ), Nasal Congestion (X 3 days.  Patient states that she also feels ear pressure in her left ear ), and Sore Throat (X 3 days )   Discussed the use of AI scribe software for clinical note transcription with the patient, who gave verbal consent to proceed.  History of Present Illness   The patient presents with a history of recurrent yeast infections and bacterial vaginosis (BV), which she believes may be related to hormonal changes. She reports that the frequency of these infections has decreased over time, but she continues to experience symptoms. The patient describes a burning sensation, particularly during sexual intercourse, and has attempted to alleviate this by using a natural lubricant. She also reports a white discharge, which was initially cottage cheese-like in consistency. The patient has tried various over-the-counter treatments, including one-day, three-day, and seven-day treatments, and found the three-day treatment to be most effective. However, the relief is temporary, and the white discharge returns. The patient also reports a foul odor, which was particularly noticeable the day before the consultation.  In addition to the gynecological concerns, the patient also reports congestion, which has been present since the previous Saturday. She describes the congestion as being worse on one side and has also experienced a sore throat for the past three days. The patient has tried using  her spouse's Flonase, but found it unpleasant. She reports no fever or chills, but does note that her nose alternates between being blocked and running.  The patient also mentions frequent use of a hot tub, which she has had for about a year. She notes that her yeast infections have been occurring for approximately four years, but seem to have worsened since the acquisition of the hot tub.          Relevant past medical, surgical, family, and social history reviewed and updated as indicated.  Allergies and medications reviewed and updated. Data reviewed: Chart in Epic.   Past Medical History:  Diagnosis Date   GERD (gastroesophageal reflux disease)     Past Surgical History:  Procedure Laterality Date   BREAST SURGERY     LASIK      Social History   Socioeconomic History   Marital status: Married    Spouse name: Not on file   Number of children: Not on file   Years of education: Not on file   Highest education level: GED or equivalent  Occupational History   Not on file  Tobacco Use   Smoking status: Never   Smokeless tobacco: Never  Vaping Use   Vaping status: Never Used  Substance and Sexual Activity   Alcohol use: No   Drug use: No   Sexual activity: Not on file  Other Topics Concern   Not on file  Social History Narrative   Not on file   Social Drivers of Health   Financial Resource Strain: Low Risk  (  01/18/2023)   Overall Financial Resource Strain (CARDIA)    Difficulty of Paying Living Expenses: Not hard at all  Food Insecurity: No Food Insecurity (01/18/2023)   Hunger Vital Sign    Worried About Running Out of Food in the Last Year: Never true    Ran Out of Food in the Last Year: Never true  Transportation Needs: No Transportation Needs (01/18/2023)   PRAPARE - Administrator, Civil Service (Medical): No    Lack of Transportation (Non-Medical): No  Physical Activity: Sufficiently Active (01/18/2023)   Exercise Vital Sign    Days of Exercise  per Week: 5 days    Minutes of Exercise per Session: 60 min  Stress: No Stress Concern Present (01/18/2023)   [employer] of Occupational Health - Occupational Stress Questionnaire    Feeling of Stress : Not at all  Social Connections: Unknown (01/18/2023)   Social Connection and Isolation Panel [NHANES]    Frequency of Communication with Friends and Family: Patient declined    Frequency of Social Gatherings with Friends and Family: Three times a week    Attends Religious Services: Patient declined    Active Member of Clubs or Organizations: Patient declined    Attends Banker Meetings: Not on file    Marital Status: Married  Intimate Partner Violence: Unknown (11/25/2021)   Received from [facility], [facility]   HITS    Physically Hurt: Not on file    Insult or Talk Down To: Not on file    Threaten Physical Harm: Not on file    Scream or Curse: Not on file    Outpatient Encounter Medications as of 08/09/2023  Medication Sig   Chlorphen-PE-Acetaminophen 4-10-325 MG TABS Take 1 tablet by mouth every 6 (six) hours as needed.   esomeprazole (NEXIUM) 40 MG capsule TAKE 1 CAPSULE (40 MG TOTAL) BY MOUTH DAILY.   fexofenadine (ALLEGRA ALLERGY) 180 MG tablet Take 1 tablet (180 mg total) by mouth daily.   fluconazole (DIFLUCAN) 150 MG tablet Take 1 tablet (150 mg total) by mouth once for 1 dose.   metroNIDAZOLE (FLAGYL) 500 MG tablet Take 1 tablet (500 mg total) by mouth 2 (two) times daily.   metroNIDAZOLE (METROGEL) 0.75 % vaginal gel Place 1 Applicatorful vaginally daily as needed (intercourse).   NIFEdipine (PROCARDIA) 10 MG capsule Take 1 capsule (10 mg total) by mouth in the morning and at bedtime.   phentermine (ADIPEX-P) 37.5 MG tablet Take 0.5-1 tablets (18.75-37.5 mg total) by mouth daily before breakfast. X1 month.  Then take 1 tablet every other day until gone.   triamcinolone (NASACORT) 55 MCG/ACT AERO nasal inhaler Place 2 sprays into the nose daily.    [DISCONTINUED] fluconazole (DIFLUCAN) 150 MG tablet Take 1 tablet now.  Then repeat in 3 days if needed for ongoing symptoms.   No facility-administered encounter medications on file as of 08/09/2023.    No Known Allergies  Pertinent ROS per HPI, otherwise unremarkable      Objective:  BP 115/69   Pulse 74   Temp 98.2 F (36.8 C)   Ht 5\' 5"  (1.651 m)   Wt 184 lb 3.2 oz (83.6 kg)   SpO2 97%   BMI 30.65 kg/m    Wt Readings from Last 3 Encounters:  08/09/23 184 lb 3.2 oz (83.6 kg)  07/25/23 182 lb (82.6 kg)  05/04/23 181 lb (82.1 kg)    Physical Exam Vitals and nursing note reviewed.  Constitutional:  Appearance: Normal appearance. She is obese.  HENT:     Head: Normocephalic and atraumatic.     Right Ear: Hearing, ear canal and external ear normal. A middle ear effusion is present. Tympanic membrane is not erythematous.     Left Ear: Hearing, ear canal and external ear normal. A middle ear effusion is present. Tympanic membrane is not erythematous.     Nose: Congestion present.     Right Sinus: No maxillary sinus tenderness or frontal sinus tenderness.     Left Sinus: No maxillary sinus tenderness or frontal sinus tenderness.     Mouth/Throat:     Lips: Pink.     Mouth: Mucous membranes are moist.     Pharynx: Posterior oropharyngeal erythema and postnasal drip present. No pharyngeal swelling, oropharyngeal exudate or uvula swelling.  Eyes:     Conjunctiva/sclera: Conjunctivae normal.     Pupils: Pupils are equal, round, and reactive to light.  Cardiovascular:     Rate and Rhythm: Normal rate and regular rhythm.     Heart sounds: Normal heart sounds.  Pulmonary:     Effort: Pulmonary effort is normal.     Breath sounds: Normal breath sounds.  Genitourinary:    Comments: Deferred, did self wet prep Musculoskeletal:     Cervical back: Neck supple.  Lymphadenopathy:     Cervical: No cervical adenopathy.  Skin:    General: Skin is warm and dry.     Capillary  Refill: Capillary refill takes less than 2 seconds.  Neurological:     General: No focal deficit present.     Mental Status: She is alert and oriented to person, place, and time.  Psychiatric:        Mood and Affect: Mood normal.        Behavior: Behavior normal.        Thought Content: Thought content normal.        Judgment: Judgment normal.     Results for orders placed or performed in visit on 07/25/23  Lipid Panel   Collection Time: 07/25/23  3:11 PM  Result Value Ref Range   Cholesterol, Total 120 100 - 199 mg/dL   Triglycerides 57 0 - 149 mg/dL   HDL 56 >27 mg/dL   VLDL Cholesterol Cal 13 5 - 40 mg/dL   LDL Chol Calc (NIH) 51 0 - 99 mg/dL   Chol/HDL Ratio 2.1 0.0 - 4.4 ratio       Pertinent labs & imaging results that were available during my care of the patient were reviewed by me and considered in my medical decision making.  Assessment & Plan:  Sharon Foster was seen today for vaginal odor, vaginal discharge, nasal congestion and sore throat.  Diagnoses and all orders for this visit:  Vaginal odor -     WET PREP FOR TRICH, YEAST, CLUE  BV (bacterial vaginosis) -     metroNIDAZOLE (FLAGYL) 500 MG tablet; Take 1 tablet (500 mg total) by mouth 2 (two) times daily.  URI with cough and congestion -     Chlorphen-PE-Acetaminophen 4-10-325 MG TABS; Take 1 tablet by mouth every 6 (six) hours as needed.  History of vaginitis -     fluconazole (DIFLUCAN) 150 MG tablet; Take 1 tablet (150 mg total) by mouth once for 1 dose.     Assessment and Plan    Bacterial Vaginosis (BV) Recurrent BV with symptoms of burning, white discharge, and odor. Wet prep confirmed clue cells. Likely exacerbated by hot tub use. Discussed  maintaining vaginal flora and pH balance, and potential benefits of switching to a saltwater hot tub. - Prescribe Flagyl 500 mg BID for 7 days - Advise no alcohol during Flagyl treatment - Prescribe Diflucan post-Flagyl - Recommend plain Ivory soap or  pH-balanced vaginal wash - Suggest Refresh Gel, a pH-balanced vaginal moisturizer with probiotics - Discuss switching hot tub to salt water  Recurrent Yeast Infections Recurrent yeast infections, often post-antibiotic use. No yeast on current wet prep. Discussed antibiotics disrupting vaginal flora and using Diflucan post-antibiotic to prevent infections. - Prescribe Diflucan post-Flagyl - Advise monitoring for yeast infection symptoms post-antibiotic  Upper Respiratory Infection (URI) Congestion, sore throat, and fluid behind the ear since Saturday. No fever or chills. Likely viral. Discussed typical 7-14 day duration and no antibiotics unless symptoms worsen or fever develops. - Prescribe Neoral AD for antihistamine, decongestant, and pain/fever relief - Advise avoiding antibiotics unless symptoms worsen or fever develops - Recommend Flonase if tolerable - Instruct to contact if symptoms persist beyond 7-14 days or if fever develops  Follow-up - Instruct to contact if URI symptoms persist beyond 7-14 days or if fever develops - Advise monitoring for recurrence of BV or yeast infection and follow up as needed.          Continue all other maintenance medications.  Follow up plan: Return if symptoms worsen or fail to improve.   Continue healthy lifestyle choices, including diet (rich in fruits, vegetables, and lean proteins, and low in salt and simple carbohydrates) and exercise (at least 30 minutes of moderate physical activity daily).  Educational handout given for BV  The above assessment and management plan was discussed with the patient. The patient verbalized understanding of and has agreed to the management plan. Patient is aware to call the clinic if they develop any new symptoms or if symptoms persist or worsen. Patient is aware when to return to the clinic for a follow-up visit. Patient educated on when it is appropriate to go to the emergency department.   Kari Baars, FNP-C [facility] [phone]

## 2023-08-28 ENCOUNTER — Other Ambulatory Visit: Payer: Self-pay | Admitting: Family Medicine

## 2023-08-28 DIAGNOSIS — K219 Gastro-esophageal reflux disease without esophagitis: Secondary | ICD-10-CM

## 2023-09-26 NOTE — Progress Notes (Addendum)
 Virtual Visit via video Note Due to COVID-19 pandemic this visit was conducted virtually. This visit type was conducted due to national recommendations for restrictions regarding the COVID-19 Pandemic (e.g. social distancing, sheltering in place) in an effort to limit this patient's exposure and mitigate transmission in our community. All issues noted in this document were discussed and addressed.  A physical exam was not performed with this format.   I connected with Sharon Foster on 09/27/2023 at 0820 by name and DOB and verified that I am speaking with the correct person using two identifiers. Sharon Foster is currently located at home during visit. The provider, Nena Deitra Morton Sebastian, DNP is located in their office at time of visit.  I discussed the limitations, risks, security and privacy concerns of performing an evaluation and management service by virtual visit and the availability of in person appointments. I also discussed with the patient that there may be a patient responsible charge related to this service. The patient expressed understanding and agreed to proceed.  Subjective:  Patient ID: Sharon Foster, female    DOB: 09-03-1975, 48 y.o.   MRN: [id number]  Chief Complaint:  Cough, Sore Throat, and URI (Productive cough with fever since Sunday with fever of 99.9 relieve with Tylenol . Throat is cover with a white film)  Sharon Foster is a 48 y.o. female presenting on 09/27/2023 vide telehealth for Cough, Sore Throat, and URI (Productive cough with fever since Sunday with fever of 99.9 relieve with Tylenol . Throat is cover with a white film) She reports productive cough with clear mucus, fever of 99.9 for 3 days and left ear pressure that she has been taking Allegra  and Nasacort  with minimal relief. Reports sore throat  I saw a white film on my throat yesterday:. Denies SOB, chest pain, syncope Client is instructed to go to the office to get swab for Strep, Flu and  COVID  Relevant past medical, surgical, family, and social history reviewed and updated as indicated.  Allergies and medications reviewed and updated.   Past Medical History:  Diagnosis Date   GERD (gastroesophageal reflux disease)     Past Surgical History:  Procedure Laterality Date   BREAST SURGERY     LASIK      Social History   Socioeconomic History   Marital status: Married    Spouse name: Not on file   Number of children: Not on file   Years of education: Not on file   Highest education level: GED or equivalent  Occupational History   Not on file  Tobacco Use   Smoking status: Never   Smokeless tobacco: Never  Vaping Use   Vaping status: Never Used  Substance and Sexual Activity   Alcohol use: No   Drug use: No   Sexual activity: Not on file  Other Topics Concern   Not on file  Social History Narrative   Not on file   Social Drivers of Health   Financial Resource Strain: Low Risk  (01/18/2023)   Overall Financial Resource Strain (CARDIA)    Difficulty of Paying Living Expenses: Not hard at all  Food Insecurity: No Food Insecurity (01/18/2023)   Hunger Vital Sign    Worried About Running Out of Food in the Last Year: Never true    Ran Out of Food in the Last Year: Never true  Transportation Needs: No Transportation Needs (01/18/2023)   PRAPARE - Administrator, Civil Service (Medical): No  Lack of Transportation (Non-Medical): No  Physical Activity: Sufficiently Active (01/18/2023)   Exercise Vital Sign    Days of Exercise per Week: 5 days    Minutes of Exercise per Session: 60 min  Stress: No Stress Concern Present (01/18/2023)   [employer] of Occupational Health - Occupational Stress Questionnaire    Feeling of Stress : Not at all  Social Connections: Unknown (01/18/2023)   Social Connection and Isolation Panel [NHANES]    Frequency of Communication with Friends and Family: Patient declined    Frequency of Social Gatherings with  Friends and Family: Three times a week    Attends Religious Services: Patient declined    Active Member of Clubs or Organizations: Patient declined    Attends Banker Meetings: Not on file    Marital Status: Married  Intimate Partner Violence: Unknown (11/25/2021)   Received from [employer], [facility]   HITS    Physically Hurt: Not on file    Insult or Talk Down To: Not on file    Threaten Physical Harm: Not on file    Scream or Curse: Not on file    Outpatient Encounter Medications as of 09/27/2023  Medication Sig   benzonatate  (TESSALON  PERLES) 100 MG capsule Take 1 capsule (100 mg total) by mouth 3 (three) times daily as needed.   oseltamivir  (TAMIFLU ) 75 MG capsule Take 1 capsule (75 mg total) by mouth 2 (two) times daily for 5 days.   Chlorphen-PE-Acetaminophen  4-10-325 MG TABS Take 1 tablet by mouth every 6 (six) hours as needed.   esomeprazole  (NEXIUM ) 40 MG capsule TAKE 1 CAPSULE (40 MG TOTAL) BY MOUTH DAILY.   fexofenadine  (ALLEGRA  ALLERGY) 180 MG tablet Take 1 tablet (180 mg total) by mouth daily.   metroNIDAZOLE  (FLAGYL ) 500 MG tablet Take 1 tablet (500 mg total) by mouth 2 (two) times daily.   metroNIDAZOLE  (METROGEL ) 0.75 % vaginal gel Place 1 Applicatorful vaginally daily as needed (intercourse).   NIFEdipine  (PROCARDIA ) 10 MG capsule Take 1 capsule (10 mg total) by mouth in the morning and at bedtime.   phentermine  (ADIPEX-P ) 37.5 MG tablet Take 0.5-1 tablets (18.75-37.5 mg total) by mouth daily before breakfast. X1 month.  Then take 1 tablet every other day until gone.   triamcinolone  (NASACORT ) 55 MCG/ACT AERO nasal inhaler Place 2 sprays into the nose daily.   No facility-administered encounter medications on file as of 09/27/2023.    No Known Allergies  Review of Systems  Constitutional:  Positive for fatigue and fever. Negative for appetite change.       99.9 relieve with Tylenol   HENT:  Positive for ear pain and sore throat. Negative for tinnitus  and trouble swallowing.        Left ear pain that is relief with Allegra  and Nasacort   White film on throat one day ago  Respiratory:  Positive for cough. Negative for shortness of breath and wheezing.        Clear sputum  Cardiovascular:  Negative for chest pain, palpitations and leg swelling.  Gastrointestinal:  Negative for constipation, diarrhea, nausea and vomiting.  Genitourinary:  Negative for dysuria and urgency.  Musculoskeletal:  Negative for back pain and myalgias.  Skin:  Negative for color change, pallor and rash.  Neurological:  Negative for dizziness and light-headedness.  Psychiatric/Behavioral:  Negative for suicidal ideas.     Observations/Objective: No vital signs or physical exam, this was a virtual health encounter.  Pt alert and oriented, answers all questions appropriately, and  able to speak in full sentences.    Assessment and Plan: Jasmyn was seen today for cough, sore throat and uri.  Diagnoses and all orders for this visit:  Influenza A virus present -     oseltamivir  (TAMIFLU ) 75 MG capsule; Take 1 capsule (75 mg total) by mouth 2 (two) times daily for 5 days.  Left ear pain  URI with cough and congestion -     Rapid Strep Screen ([facility] ONLY) -     Veritor Flu A/B Waived -     Novel Coronavirus, NAA ([facility]) -     oseltamivir  (TAMIFLU ) 75 MG capsule; Take 1 capsule (75 mg total) by mouth 2 (two) times daily for 5 days. -     benzonatate  (TESSALON  PERLES) 100 MG capsule; Take 1 capsule (100 mg total) by mouth 3 (three) times daily as needed.  Strep negative   Haly is 48 yrs old caucasian female seen for URI symptoms, no acute distress  Influenza A: Tamiflu  BID for 5-days # 10 dispensed, increase hydration, rest, continue tylenol  for fever  Cough: tessalon  pearls 100 mg TID PRN, take with 8 oz of water.  Left ear pain/fullness: continue Allegra  and Nasacort   COVID results pending  Advice her to make F2F appointment is ear pain  is not resolve with current treatment  Follow Up Instructions: Return if symptoms worsen or fail to improve.  I discussed the assessment and treatment plan with the patient. The patient was provided an opportunity to ask questions and all were answered. The patient agreed with the plan and demonstrated an understanding of the instructions.   The patient was advised to call back or seek an in-person evaluation if the symptoms worsen or if the condition fails to improve as anticipated.  The above assessment and management plan was discussed with the patient. The patient verbalized understanding of and has agreed to the management plan. Patient is aware to call the clinic if they develop any new symptoms or if symptoms persist or worsen. Patient is aware when to return to the clinic for a follow-up visit. Patient educated on when it is appropriate to go to the emergency department.    I provided 12 minutes of time during this video encounter.   Aayansh Codispoti [place] Thompson, DNP [facility] [address] [place], [place] [address] [phone] 09/27/2023

## 2023-09-27 ENCOUNTER — Encounter: Payer: Self-pay | Admitting: Nurse Practitioner

## 2023-09-27 ENCOUNTER — Telehealth: Payer: Self-pay | Admitting: Nurse Practitioner

## 2023-09-27 ENCOUNTER — Ambulatory Visit: Payer: Self-pay | Admitting: Nurse Practitioner

## 2023-09-27 DIAGNOSIS — J101 Influenza due to other identified influenza virus with other respiratory manifestations: Secondary | ICD-10-CM | POA: Diagnosis not present

## 2023-09-27 DIAGNOSIS — J069 Acute upper respiratory infection, unspecified: Secondary | ICD-10-CM | POA: Insufficient documentation

## 2023-09-27 DIAGNOSIS — H9202 Otalgia, left ear: Secondary | ICD-10-CM | POA: Diagnosis not present

## 2023-09-27 LAB — RAPID STREP SCREEN (MED CTR MEBANE ONLY): Strep Gp A Ag, IA W/Reflex: NEGATIVE

## 2023-09-27 LAB — VERITOR FLU A/B WAIVED
Influenza A: POSITIVE — AB
Influenza B: NEGATIVE

## 2023-09-27 LAB — CULTURE, GROUP A STREP

## 2023-09-27 MED ORDER — OSELTAMIVIR PHOSPHATE 75 MG PO CAPS: 75.0000 mg | ORAL_CAPSULE | Freq: Two times a day (BID) | ORAL | 0 refills | Status: AC

## 2023-09-27 MED ORDER — BENZONATATE 100 MG PO CAPS: 100.0000 mg | ORAL_CAPSULE | Freq: Three times a day (TID) | ORAL | 0 refills | Status: DC | PRN

## 2023-09-28 ENCOUNTER — Telehealth: Payer: Self-pay

## 2023-09-28 LAB — NOVEL CORONAVIRUS, NAA: SARS-CoV-2, NAA: NOT DETECTED

## 2023-09-28 NOTE — Telephone Encounter (Signed)
 Copied from CRM [id number]. Topic: General - Other >> Sep 28, 2023  8:18 AM Carlatta H wrote: Reason for CRM: Patient received a call from the nurse//Please call back to [phone]

## 2023-10-03 ENCOUNTER — Other Ambulatory Visit: Payer: Self-pay | Admitting: Nurse Practitioner

## 2023-10-03 DIAGNOSIS — J069 Acute upper respiratory infection, unspecified: Secondary | ICD-10-CM

## 2023-10-03 MED ORDER — GUAIFENESIN 400 MG PO TABS: 400.0000 mg | ORAL_TABLET | Freq: Four times a day (QID) | ORAL | 0 refills | Status: DC | PRN

## 2023-10-03 NOTE — Progress Notes (Signed)
 Guaifenesin  400 mg 1-tab TID for cough

## 2023-10-03 NOTE — Telephone Encounter (Signed)
 Please reroute to prescriber.  She may consider adding a CXR to eval for secondary bacterial process but it appears patient has been treated appropriately for influenza.

## 2023-12-03 ENCOUNTER — Other Ambulatory Visit: Payer: Self-pay | Admitting: Family Medicine

## 2023-12-03 DIAGNOSIS — K219 Gastro-esophageal reflux disease without esophagitis: Secondary | ICD-10-CM

## 2024-01-05 ENCOUNTER — Encounter: Payer: Self-pay | Admitting: Family Medicine

## 2024-01-06 ENCOUNTER — Encounter: Payer: Self-pay | Admitting: Family

## 2024-01-06 ENCOUNTER — Ambulatory Visit: Admitting: Family

## 2024-01-06 ENCOUNTER — Ambulatory Visit: Payer: Self-pay | Admitting: Family

## 2024-01-06 VITALS — BP 116/63 | HR 80 | Temp 97.2°F | Ht 65.0 in | Wt 194.0 lb

## 2024-01-06 DIAGNOSIS — N898 Other specified noninflammatory disorders of vagina: Secondary | ICD-10-CM | POA: Diagnosis not present

## 2024-01-06 DIAGNOSIS — B3731 Acute candidiasis of vulva and vagina: Secondary | ICD-10-CM

## 2024-01-06 LAB — WET PREP FOR TRICH, YEAST, CLUE
Clue Cell Exam: NEGATIVE
Trichomonas Exam: NEGATIVE
Yeast Exam: NEGATIVE

## 2024-01-06 MED ORDER — FLUCONAZOLE 150 MG PO TABS: 150.0000 mg | ORAL_TABLET | ORAL | 0 refills | Status: DC | PRN

## 2024-01-06 NOTE — Patient Instructions (Signed)

## 2024-01-06 NOTE — Progress Notes (Signed)
 Subjective:    Patient ID: Sharon Foster, female    DOB: 07/17/1976, 48 y.o.   MRN: [id number]  Chief Complaint  Patient presents with   Vaginal Discharge    X 2 months on and off    Pt presents to the office today with with vaginal discharge and dysuria that started two months on and off.  Vaginal Discharge The patient's primary symptoms include vaginal discharge. The patient's pertinent negatives include no genital itching, genital odor or vaginal bleeding. Primary symptoms comment: dysuria. The current episode started more than 1 month ago. The problem occurs intermittently. The pain is mild. Associated symptoms include dysuria and painful intercourse. Pertinent negatives include no constipation, diarrhea, discolored urine, fever, flank pain, frequency, urgency or vomiting. The vaginal discharge was white and thick. She has tried antifungals for the symptoms. The treatment provided mild relief.      Review of Systems  Constitutional:  Negative for fever.  Gastrointestinal:  Negative for constipation, diarrhea and vomiting.  Genitourinary:  Positive for dysuria and vaginal discharge. Negative for flank pain, frequency and urgency.  All other systems reviewed and are negative.   Social History   Socioeconomic History   Marital status: Married    Spouse name: Not on file   Number of children: Not on file   Years of education: Not on file   Highest education level: GED or equivalent  Occupational History   Not on file  Tobacco Use   Smoking status: Never   Smokeless tobacco: Never  Vaping Use   Vaping status: Never Used  Substance and Sexual Activity   Alcohol use: No   Drug use: No   Sexual activity: Not on file  Other Topics Concern   Not on file  Social History Narrative   Not on file   Social Drivers of Health   Financial Resource Strain: Low Risk  (01/18/2023)   Overall Financial Resource Strain (CARDIA)    Difficulty of Paying Living Expenses: Not hard at  all  Food Insecurity: No Food Insecurity (01/18/2023)   Hunger Vital Sign    Worried About Running Out of Food in the Last Year: Never true    Ran Out of Food in the Last Year: Never true  Transportation Needs: No Transportation Needs (01/18/2023)   PRAPARE - Administrator, Civil Service (Medical): No    Lack of Transportation (Non-Medical): No  Physical Activity: Sufficiently Active (01/18/2023)   Exercise Vital Sign    Days of Exercise per Week: 5 days    Minutes of Exercise per Session: 60 min  Stress: No Stress Concern Present (01/18/2023)   [employer] of Occupational Health - Occupational Stress Questionnaire    Feeling of Stress : Not at all  Social Connections: Unknown (01/18/2023)   Social Connection and Isolation Panel [NHANES]    Frequency of Communication with Friends and Family: Patient declined    Frequency of Social Gatherings with Friends and Family: Three times a week    Attends Religious Services: Patient declined    Active Member of Clubs or Organizations: Patient declined    Attends Banker Meetings: Not on file    Marital Status: Married   Family History  Problem Relation Age of Onset   Hypertension Mother    Cancer Father        Prostate   Hypertension Father         Objective:   Physical Exam Vitals reviewed.  Constitutional:  General: She is not in acute distress.    Appearance: She is well-developed.  HENT:     Head: Normocephalic and atraumatic.  Eyes:     Pupils: Pupils are equal, round, and reactive to light.  Neck:     Thyroid: No thyromegaly.  Cardiovascular:     Rate and Rhythm: Normal rate and regular rhythm.     Heart sounds: Normal heart sounds. No murmur heard. Pulmonary:     Effort: Pulmonary effort is normal. No respiratory distress.     Breath sounds: Normal breath sounds. No wheezing.  Abdominal:     General: Bowel sounds are normal. There is no distension.     Palpations: Abdomen is soft.      Tenderness: There is no abdominal tenderness.  Musculoskeletal:        General: No tenderness. Normal range of motion.     Cervical back: Normal range of motion and neck supple.  Skin:    General: Skin is warm and dry.  Neurological:     Mental Status: She is alert and oriented to person, place, and time.     Cranial Nerves: No cranial nerve deficit.     Deep Tendon Reflexes: Reflexes are normal and symmetric.  Psychiatric:        Behavior: Behavior normal.        Thought Content: Thought content normal.        Judgment: Judgment normal.       BP 116/63   Pulse 80   Temp (!) 97.2 F (36.2 C)   Ht 5\' 5"  (1.651 m)   Wt 194 lb (88 kg)   SpO2 100%   BMI 32.28 kg/m      Assessment & Plan:  Delberta Pillion Magallanes comes in today with chief complaint of Vaginal Discharge (X 2 months on and off )   Diagnosis and orders addressed:  1. Vaginal discharge (Primary) - WET PREP FOR TRICH, YEAST, CLUE  2. Vagina, candidiasis Has used OTC medications, but given current symptoms will give diflucan   Keep clean and dry Avoid scratching  Cotton underwear Encourage probiotic and yogurt daily Follow up if symptoms worsen or do not improve  - fluconazole  (DIFLUCAN ) 150 MG tablet; Take 1 tablet (150 mg total) by mouth every three (3) days as needed.  Dispense: 3 tablet; Refill: 0     Tommas Fragmin, FNP

## 2024-02-02 NOTE — Progress Notes (Signed)
 Sharon Foster is a 48 y.o. female presents to office today for annual physical exam examination.    Concerns today include: 1.  Obesity She has been steadily gaining her weight back.  She had done an excellent job with weight loss on Wegovy  but sadly her insurance stopped paying for it.  We transitioned her over temporarily to phentermine  but this really did not keep her weight down.  She does try to incorporate vegetables into her diet and she is physically active all day long and her job.  She hydrates well with water.  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, Marital status: Married, Substance use: None Health Maintenance Due  Topic Date Due   Cervical Cancer Screening (HPV/Pap Cotest)  07/31/2009   DTaP/Tdap/Td (3 - Td or Tdap) 08/10/2021   Refills needed today: None  Immunization History  Administered Date(s) Administered   Hepatitis B 11/29/2006, 12/29/2006, 10/19/2007   Influenza, Seasonal, Injecte, Preservative Fre 05/04/2023   Influenza,inj,Quad PF,6+ Mos 06/15/2018, 06/03/2019, 05/19/2022   Influenza-Unspecified 06/12/2015, 05/07/2016, 06/01/2017, 06/23/2021   MMR 04/30/2002   Td 04/30/2002   Tdap 08/11/2011   Past Medical History:  Diagnosis Date   GERD (gastroesophageal reflux disease)    Social History   Socioeconomic History   Marital status: Married    Spouse name: Not on file   Number of children: Not on file   Years of education: Not on file   Highest education level: GED or equivalent  Occupational History   Not on file  Tobacco Use   Smoking status: Never   Smokeless tobacco: Never  Vaping Use   Vaping status: Never Used  Substance and Sexual Activity   Alcohol use: No   Drug use: No   Sexual activity: Not on file  Other Topics Concern   Not on file  Social History Narrative   Not on file   Social Drivers of Health   Financial Resource Strain: Low Risk  (01/18/2023)   Overall Financial Resource Strain (CARDIA)     Difficulty of Paying Living Expenses: Not hard at all  Food Insecurity: No Food Insecurity (01/18/2023)   Hunger Vital Sign    Worried About Running Out of Food in the Last Year: Never true    Ran Out of Food in the Last Year: Never true  Transportation Needs: No Transportation Needs (01/18/2023)   PRAPARE - Administrator, Civil Service (Medical): No    Lack of Transportation (Non-Medical): No  Physical Activity: Sufficiently Active (01/18/2023)   Exercise Vital Sign    Days of Exercise per Week: 5 days    Minutes of Exercise per Session: 60 min  Stress: No Stress Concern Present (01/18/2023)   [employer] of Occupational Health - Occupational Stress Questionnaire    Feeling of Stress : Not at all  Social Connections: Unknown (01/18/2023)   Social Connection and Isolation Panel    Frequency of Communication with Friends and Family: Patient declined    Frequency of Social Gatherings with Friends and Family: Three times a week    Attends Religious Services: Patient declined    Active Member of Clubs or Organizations: Patient declined    Attends Banker Meetings: Not on file    Marital Status: Married  Intimate Partner Violence: Unknown (11/25/2021)   Received from [facility]   HITS    Physically Hurt: Not on file    Insult or Talk Down To: Not on file    Threaten  Physical Harm: Not on file    Scream or Curse: Not on file   Past Surgical History:  Procedure Laterality Date   BREAST SURGERY     LASIK     Family History  Problem Relation Age of Onset   Hypertension Mother    Cancer Father        Prostate   Hypertension Father     Current Outpatient Medications:    Chlorphen-PE-Acetaminophen  4-10-325 MG TABS, Take 1 tablet by mouth every 6 (six) hours as needed., Disp: 30 tablet, Rfl: 0   esomeprazole  (NEXIUM ) 40 MG capsule, TAKE 1 CAPSULE (40 MG TOTAL) BY MOUTH DAILY., Disp: 90 capsule, Rfl: 0   fexofenadine  (ALLEGRA  ALLERGY) 180 MG tablet, Take 1  tablet (180 mg total) by mouth daily., Disp: , Rfl:    fluconazole  (DIFLUCAN ) 150 MG tablet, Take 1 tablet (150 mg total) by mouth every three (3) days as needed., Disp: 3 tablet, Rfl: 0   guaifenesin  (HUMIBID E) 400 MG TABS tablet, Take 1 tablet (400 mg total) by mouth every 6 (six) hours as needed (Cough)., Disp: 30 tablet, Rfl: 0   metroNIDAZOLE  (METROGEL ) 0.75 % vaginal gel, Place 1 Applicatorful vaginally daily as needed (intercourse)., Disp: 70 g, Rfl: 1   NIFEdipine  (PROCARDIA ) 10 MG capsule, Take 1 capsule (10 mg total) by mouth in the morning and at bedtime. (Patient not taking: Reported on 01/06/2024), Disp: 60 capsule, Rfl: 1   phentermine  (ADIPEX-P ) 37.5 MG tablet, Take 0.5-1 tablets (18.75-37.5 mg total) by mouth daily before breakfast. X1 month.  Then take 1 tablet every other day until gone. (Patient not taking: Reported on 01/06/2024), Disp: 45 tablet, Rfl: 0   triamcinolone  (NASACORT ) 55 MCG/ACT AERO nasal inhaler, Place 2 sprays into the nose daily., Disp: , Rfl:   No Known Allergies   ROS: Review of Systems A comprehensive review of systems was negative except for: Gastrointestinal: positive for constipation    Physical exam BP (!) 90/59   Pulse 63   Temp 98.3 F (36.8 C)   Ht 5' 5 (1.651 m)   SpO2 98%   BMI 32.28 kg/m  General appearance: alert, cooperative, appears stated age, no distress, and moderately obese Head: Normocephalic, without obvious abnormality, atraumatic Eyes: negative findings: lids and lashes normal, conjunctivae and sclerae normal, corneas clear, and pupils equal, round, reactive to light and accomodation Ears: normal TM's and external ear canals both ears Nose: Nares normal. Septum midline. Mucosa normal. No drainage or sinus tenderness. Throat: lips, mucosa, and tongue normal; teeth and gums normal Neck: no adenopathy, no carotid bruit, supple, symmetrical, trachea midline, and thyroid not enlarged, symmetric, no tenderness/mass/nodules Back:  symmetric, no curvature. ROM normal. No CVA tenderness. Lungs: clear to auscultation bilaterally Heart: regular rate and rhythm, S1, S2 normal, no murmur, click, rub or gallop Abdomen: soft, non-tender; bowel sounds normal; no masses,  no organomegaly Extremities: extremities normal, atraumatic, no cyanosis or edema Pulses: 2+ and symmetric Skin: Skin color, texture, turgor normal. No rashes or lesions Lymph nodes: Cervical, supraclavicular, and axillary nodes normal. Neuro: No focal neurologic deficits     02/03/2024   10:17 AM 07/25/2023    2:36 PM 05/04/2023    9:46 AM  Depression screen PHQ 2/9  Decreased Interest 0 0 0  Down, Depressed, Hopeless 0 0 0  PHQ - 2 Score 0 0 0  Altered sleeping 0 0 0  Tired, decreased energy 0 0 0  Change in appetite 0 0 0  Feeling bad or  failure about yourself  0 0 0  Trouble concentrating 0 0 0  Moving slowly or fidgety/restless 0 0 0  Suicidal thoughts 0 0 0  PHQ-9 Score 0 0 0  Difficult doing work/chores Not difficult at all Not difficult at all Not difficult at all      02/03/2024   10:27 AM 07/25/2023    2:36 PM 05/04/2023    9:46 AM 09/15/2022    1:58 PM  GAD 7 : Generalized Anxiety Score  Nervous, Anxious, on Edge 0 0 0 0  Control/stop worrying 0 0 0 0  Worry too much - different things 0 0 0 0  Trouble relaxing 0 0 0 0  Restless 0 0 0 0  Easily annoyed or irritable 0 0 0 0  Afraid - awful might happen 0 0 0 0  Total GAD 7 Score 0 0 0 0  Anxiety Difficulty Not difficult at all  Not difficult at all      Assessment/ Plan: Sharon Foster here for annual physical exam.   Annual physical exam  Obesity (BMI 30.0-34.9) - Plan: Lipid Panel, CMP14+EGFR, Bayer DCA Hb A1c Waived, VITAMIN D 25 Hydroxy (Vit-D Deficiency, Fractures), Phentermine -Topiramate  (QSYMIA) 3.75-23 MG CP24, Phentermine -Topiramate  (QSYMIA) 7.5-46 MG CP24, Phentermine -Topiramate  (QSYMIA) 11.25-69 MG CP24  Screening, anemia, deficiency, iron - Plan: CBC  Weight  loss counseling, encounter for  Gastroesophageal reflux disease without esophagitis - Plan: esomeprazole  (NEXIUM ) 40 MG capsule  Tetanus shot administered.  Pap with OBGYN  Patient's BMI is >30 mg/m2.  Patient's current BMI is Body mass index is 32.62 kg/m.Sharon Foster  Patient is currently enrolled in a healthy eating plan along with encouraged exercise.  Previous success with wegovy  but no longer covered under insurance.  The Narcotic Database has been reviewed.  There were no red flags. Plan for UDS/ CSC at next OV pending response to med.  GERD stable with PRN use nexium .   Counseled on healthy lifestyle choices, including diet (rich in fruits, vegetables and lean meats and low in salt and simple carbohydrates) and exercise (at least 30 minutes of moderate physical activity daily).  Patient to follow up 40m for weight check  Sharon Didion M. Bonnell Butcher, DO

## 2024-02-03 ENCOUNTER — Ambulatory Visit (INDEPENDENT_AMBULATORY_CARE_PROVIDER_SITE_OTHER): Payer: BC Managed Care – PPO | Admitting: Family Medicine

## 2024-02-03 ENCOUNTER — Encounter: Payer: Self-pay | Admitting: Family Medicine

## 2024-02-03 ENCOUNTER — Ambulatory Visit: Payer: Self-pay | Admitting: Family Medicine

## 2024-02-03 VITALS — BP 90/59 | HR 63 | Temp 98.3°F | Ht 65.0 in | Wt 196.0 lb

## 2024-02-03 DIAGNOSIS — Z0000 Encounter for general adult medical examination without abnormal findings: Secondary | ICD-10-CM

## 2024-02-03 DIAGNOSIS — E66811 Obesity, class 1: Secondary | ICD-10-CM | POA: Diagnosis not present

## 2024-02-03 DIAGNOSIS — Z130 Encounter for screening for diseases of the blood and blood-forming organs and certain disorders involving the immune mechanism: Secondary | ICD-10-CM

## 2024-02-03 DIAGNOSIS — Z23 Encounter for immunization: Secondary | ICD-10-CM | POA: Diagnosis not present

## 2024-02-03 DIAGNOSIS — Z0001 Encounter for general adult medical examination with abnormal findings: Secondary | ICD-10-CM | POA: Diagnosis not present

## 2024-02-03 DIAGNOSIS — Z713 Dietary counseling and surveillance: Secondary | ICD-10-CM | POA: Diagnosis not present

## 2024-02-03 DIAGNOSIS — K219 Gastro-esophageal reflux disease without esophagitis: Secondary | ICD-10-CM | POA: Diagnosis not present

## 2024-02-03 LAB — BAYER DCA HB A1C WAIVED: HB A1C (BAYER DCA - WAIVED): 4.8 % (ref 4.8–5.6)

## 2024-02-03 MED ORDER — ESOMEPRAZOLE MAGNESIUM 40 MG PO CPDR: 40.0000 mg | DELAYED_RELEASE_CAPSULE | Freq: Every day | ORAL | 3 refills | Status: DC

## 2024-02-03 MED ORDER — PHENTERMINE-TOPIRAMATE ER 3.75-23 MG PO CP24: 1.0000 | ORAL_CAPSULE | Freq: Every morning | ORAL | 0 refills | Status: AC

## 2024-02-03 MED ORDER — PHENTERMINE-TOPIRAMATE ER 11.25-69 MG PO CP24: 1.0000 | ORAL_CAPSULE | Freq: Every morning | ORAL | 0 refills | Status: AC

## 2024-02-03 MED ORDER — PHENTERMINE-TOPIRAMATE ER 7.5-46 MG PO CP24: 1.0000 | ORAL_CAPSULE | Freq: Every morning | ORAL | 0 refills | Status: AC

## 2024-02-03 NOTE — Patient Instructions (Signed)
 Mediterranean Diet A Mediterranean diet is based on the traditions of countries on the [employer]. It focuses on eating more: Fruits and vegetables. Whole grains, beans, nuts, and seeds. Heart-healthy fats. These are fats that are good for your heart. It involves eating less: Dairy. Meat and eggs. Processed foods with added sugar, salt, and fat. This type of diet can help prevent certain conditions. It can also improve outcomes if you have a long-term (chronic) disease, such as kidney or heart disease. What are tips for following this plan? Reading food labels Check packaged foods for: The serving size. For foods such as rice and pasta, the serving size is the amount of cooked product, not dry. The total fat. Avoid foods with saturated fat or trans fat. Added sugars, such as corn syrup. Shopping  Try to have a balanced diet. Buy a variety of foods, such as: Fresh fruits and vegetables. You may be able to get these from local farmers markets. You can also buy them frozen. Grains, beans, nuts, and seeds. Some of these can be bought in bulk. Fresh seafood. Poultry and eggs. Low-fat dairy products. Buy whole ingredients instead of foods that have already been packaged. If you can't get fresh seafood, buy precooked frozen shrimp or canned fish, such as tuna, salmon, or sardines. Stock your pantry so you always have certain foods on hand, such as olive oil, canned tuna, canned tomatoes, rice, pasta, and beans. Cooking Cook foods with extra-virgin olive oil instead of using butter or other vegetable oils. Have meat as a side dish. Have vegetables or grains as your main dish. This means having meat in small portions or adding small amounts of meat to foods like pasta or stew. Use beans or vegetables instead of meat in common dishes like chili or lasagna. Try out different cooking methods. Try roasting, broiling, steaming, and sauting vegetables. Add frozen vegetables to soups, stews,  pasta, or rice. Add nuts or seeds for added healthy fats and plant protein at each meal. You can add these to yogurt, salads, or vegetable dishes. Marinate fish or vegetables using olive oil, lemon juice, garlic, and fresh herbs. Meal planning Plan to eat a vegetarian meal one day each week. Try to work up to two vegetarian meals, if possible. Eat seafood two or more times a week. Have healthy snacks on hand. These may include: Vegetable sticks with hummus. Greek yogurt. Fruit and nut trail mix. Eat balanced meals. These should include: Fruit: 2-3 servings a day. Vegetables: 4-5 servings a day. Low-fat dairy: 2 servings a day. Fish, poultry, or lean meat: 1 serving a day. Beans and legumes: 2 or more servings a week. Nuts and seeds: 1-2 servings a day. Whole grains: 6-8 servings a day. Extra-virgin olive oil: 3-4 servings a day. Limit red meat and sweets to just a few servings a month. Lifestyle  Try to cook and eat meals with your family. Drink enough fluid to keep your pee (urine) pale yellow. Be active every day. This includes: Aerobic exercise, which is exercise that causes your heart to beat faster. Examples include running and swimming. Leisure activities like gardening, walking, or housework. Get 7-8 hours of sleep each night. Drink red wine if your provider says you can. A glass of wine is 5 oz (150 mL). You may be allowed to have: Up to 1 glass a day if you're female and not pregnant. Up to 2 glasses a day if you're female. What foods should I eat? Fruits Apples. Apricots. Avocado.  Berries. Bananas. Cherries. Dates. Figs. Grapes. Lemons. Melon. Oranges. Peaches. Plums. Pomegranate. Vegetables Artichokes. Beets. Broccoli. Cabbage. Carrots. Eggplant. Green beans. Chard. Kale. Spinach. Onions. Leeks. Peas. Squash. Tomatoes. Peppers. Radishes. Grains Whole-grain pasta. Brown rice. Bulgur wheat. Polenta. Couscous. Whole-wheat bread. Orpah Cobb. Meats and other  proteins Beans. Almonds. Sunflower seeds. Pine nuts. Peanuts. Cod. Salmon. Scallops. Shrimp. Tuna. Tilapia. Clams. Oysters. Eggs. Chicken or [place] without skin. Dairy Low-fat milk. Cheese. Greek yogurt. Fats and oils Extra-virgin olive oil. Avocado oil. Grapeseed oil. Beverages Water. Red wine. Herbal tea. Sweets and desserts Greek yogurt with honey. Baked apples. Poached pears. Trail mix. Seasonings and condiments Basil. Cilantro. Coriander. Cumin. Mint. Parsley. Sage. Rosemary. Tarragon. Garlic. Oregano. Thyme. Pepper. Balsamic vinegar. Tahini. Hummus. Tomato sauce. Olives. Mushrooms. The items listed above may not be all the foods and drinks you can have. Talk to a dietitian to learn more. What foods should I limit? This is a list of foods that should be eaten rarely. Fruits Fruit canned in syrup. Vegetables Deep-fried potatoes, like [place] fries. Grains Packaged pasta or rice dishes. Cereal with added sugar. Snacks with added sugar. Meats and other proteins Beef. Pork. Lamb. Chicken or [place] with skin. Hot dogs. Tomasa Blase. Dairy Ice cream. Sour cream. Whole milk. Fats and oils Butter. Canola oil. Vegetable oil. Beef fat (tallow). Lard. Beverages Juice. Sugar-sweetened soft drinks. Beer. Liquor and spirits. Sweets and desserts Cookies. Cakes. Pies. Candy. Seasonings and condiments Mayonnaise. Pre-made sauces and marinades. The items listed above may not be all the foods and drinks you should limit. Talk to a dietitian to learn more. Where to find more information American Heart Association (AHA): heart.org This information is not intended to replace advice given to you by your health care provider. Make sure you discuss any questions you have with your health care provider. Document Revised: 11/21/2022 Document Reviewed: 11/21/2022 Elsevier Patient Education  2024 [employer].

## 2024-02-04 LAB — CMP14+EGFR
ALT: 17 IU/L (ref 0–32)
AST: 24 IU/L (ref 0–40)
Albumin: 4.3 g/dL (ref 3.9–4.9)
Alkaline Phosphatase: 76 IU/L (ref 44–121)
BUN/Creatinine Ratio: 11 (ref 9–23)
BUN: 10 mg/dL (ref 6–24)
Bilirubin Total: 1.2 mg/dL (ref 0.0–1.2)
CO2: 22 mmol/L (ref 20–29)
Calcium: 8.8 mg/dL (ref 8.7–10.2)
Chloride: 105 mmol/L (ref 96–106)
Creatinine, Ser: 0.94 mg/dL (ref 0.57–1.00)
Globulin, Total: 2.4 g/dL (ref 1.5–4.5)
Glucose: 87 mg/dL (ref 70–99)
Potassium: 4.3 mmol/L (ref 3.5–5.2)
Sodium: 141 mmol/L (ref 134–144)
Total Protein: 6.7 g/dL (ref 6.0–8.5)
eGFR: 75 mL/min/{1.73_m2} (ref >59)

## 2024-02-04 LAB — CBC
Hematocrit: 42.8 % (ref 34.0–46.6)
Hemoglobin: 14.2 g/dL (ref 11.1–15.9)
MCH: 33.4 pg — ABNORMAL HIGH (ref 26.6–33.0)
MCHC: 33.2 g/dL (ref 31.5–35.7)
MCV: 101 fL — ABNORMAL HIGH (ref 79–97)
Platelets: 188 10*3/uL (ref 150–450)
RBC: 4.25 x10E6/uL (ref 3.77–5.28)
RDW: 11.8 % (ref 11.7–15.4)
WBC: 4.7 10*3/uL (ref 3.4–10.8)

## 2024-02-04 LAB — LIPID PANEL
Chol/HDL Ratio: 2.2 ratio (ref 0.0–4.4)
Cholesterol, Total: 109 mg/dL (ref 100–199)
HDL: 50 mg/dL
LDL Chol Calc (NIH): 46 mg/dL (ref 0–99)
Triglycerides: 55 mg/dL (ref 0–149)
VLDL Cholesterol Cal: 13 mg/dL (ref 5–40)

## 2024-02-04 LAB — VITAMIN D 25 HYDROXY (VIT D DEFICIENCY, FRACTURES): Vit D, 25-Hydroxy: 69.1 ng/mL (ref 30.0–100.0)

## 2024-02-06 ENCOUNTER — Other Ambulatory Visit (HOSPITAL_COMMUNITY): Payer: Self-pay

## 2024-02-10 ENCOUNTER — Encounter: Payer: Self-pay | Admitting: Family Medicine

## 2024-02-14 ENCOUNTER — Other Ambulatory Visit (HOSPITAL_COMMUNITY): Payer: Self-pay

## 2024-02-14 ENCOUNTER — Telehealth: Payer: Self-pay | Admitting: Pharmacy Technician

## 2024-02-14 NOTE — Telephone Encounter (Signed)
 Pharmacy Patient Advocate Encounter   Received notification from Onbase that prior authorization for Phentermine -Topiramate  ER 3.75-23MG  er capsules is required/requested.   Insurance verification completed.   The patient is insured through [facility] .   Per test claim: PA required; PA submitted to above mentioned insurance via CoverMyMeds Key/confirmation #/EOC [id number] Status is pending

## 2024-02-15 ENCOUNTER — Other Ambulatory Visit (HOSPITAL_COMMUNITY): Payer: Self-pay

## 2024-02-15 NOTE — Telephone Encounter (Signed)
 Pharmacy Patient Advocate Encounter  Received notification from [facility] that Prior Authorization for Phentermine -Topiramate  ER 3.75-23MG  er capsules has been APPROVED from 02/14/2024 to 05/16/2024. Unable to obtain price due to refill too soon rejection, last fill date 02/14/2024 next available fill date07/18/2025   PA #/Case ID/Reference #: [id number]

## 2024-03-14 ENCOUNTER — Other Ambulatory Visit: Payer: Self-pay | Admitting: Family Medicine

## 2024-03-14 DIAGNOSIS — K219 Gastro-esophageal reflux disease without esophagitis: Secondary | ICD-10-CM

## 2024-03-15 LAB — HM MAMMOGRAPHY

## 2024-04-20 ENCOUNTER — Ambulatory Visit (INDEPENDENT_AMBULATORY_CARE_PROVIDER_SITE_OTHER)

## 2024-04-20 ENCOUNTER — Encounter: Payer: Self-pay | Admitting: Family Medicine

## 2024-04-20 ENCOUNTER — Ambulatory Visit: Payer: Self-pay | Admitting: Family Medicine

## 2024-04-20 VITALS — BP 106/69 | HR 65 | Temp 98.9°F | Ht 65.0 in | Wt 197.0 lb

## 2024-04-20 DIAGNOSIS — N760 Acute vaginitis: Secondary | ICD-10-CM

## 2024-04-20 DIAGNOSIS — B9689 Other specified bacterial agents as the cause of diseases classified elsewhere: Secondary | ICD-10-CM

## 2024-04-20 LAB — URINALYSIS, ROUTINE W REFLEX MICROSCOPIC
Bilirubin, UA: NEGATIVE
Glucose, UA: NEGATIVE
Ketones, UA: NEGATIVE
Leukocytes,UA: NEGATIVE
Nitrite, UA: NEGATIVE
Protein,UA: NEGATIVE
Specific Gravity, UA: 1.025 (ref 1.005–1.030)
Urobilinogen, Ur: 1 mg/dL (ref 0.2–1.0)
pH, UA: 5.5 (ref 5.0–7.5)

## 2024-04-20 LAB — MICROSCOPIC EXAMINATION
RBC, Urine: NONE SEEN /HPF (ref 0–2)
Renal Epithel, UA: NONE SEEN /HPF
Yeast, UA: NONE SEEN

## 2024-04-20 LAB — WET PREP FOR TRICH, YEAST, CLUE
Clue Cell Exam: POSITIVE — AB
Trichomonas Exam: NEGATIVE
Yeast Exam: NEGATIVE

## 2024-04-20 MED ORDER — FLUCONAZOLE 150 MG PO TABS: 150.0000 mg | ORAL_TABLET | ORAL | 0 refills | Status: AC

## 2024-04-20 MED ORDER — METRONIDAZOLE 500 MG PO TABS: 500.0000 mg | ORAL_TABLET | Freq: Two times a day (BID) | ORAL | 0 refills | Status: AC

## 2024-04-20 NOTE — Progress Notes (Signed)
 Subjective:  Patient ID: Sharon Foster, female    DOB: May 22, 1976, 48 y.o.   MRN: [id number]  Patient Care Team: Jolinda Norene HERO, DO as PCP - General (Family Medicine)   Chief Complaint:  Vaginitis   HPI: Sharon Foster is a 48 y.o. female presenting on 04/20/2024 for Vaginitis    Sharon Foster is a 48 year old female who presents with recurrent bacterial vaginosis and yeast infections.  She has been experiencing recurrent bacterial vaginosis (BV) and yeast infections. BV was initially diagnosed during a routine exam approximately a year and a half ago and treated with Flagyl . Following the BV treatment, she developed a yeast infection characterized by large, thumb-sized chunks, redness, and burning, which has persisted for over a month. She attempted treatment with three-day suppositories, which provided temporary relief, but symptoms returned shortly after.  She describes intermittent spotting, with recent episodes lasting a short duration, and another brief episode in April. The spotting is described as dark brown, resembling scabs, and more recently, bright red bleeding was noted, which she attributes to the use of metronidazole  gel. She has been using metronidazole  cream and has tried boric acid suppositories and Diflucan  in the past. Diflucan  has been effective in preventing recurrence when taken weekly, but she reports that her provider does not want her to use it for an extended period of time.  She is currently using estrogen insert pills twice a week and oral probiotics. She has also tried vaginal probiotics and boric acid suppositories, but did not find them helpful. She mentions avoiding the use of her hot tub to prevent infections. She reports a recent episode of a slight odor noticed during intercourse, which resolved the following day. She expresses frustration with the recurrent nature of her symptoms, describing it as 'bouncing back and forth' between BV and  yeast infections.          Relevant past medical, surgical, family, and social history reviewed and updated as indicated.  Allergies and medications reviewed and updated. Data reviewed: Chart in Epic.   Past Medical History:  Diagnosis Date   GERD (gastroesophageal reflux disease)     Past Surgical History:  Procedure Laterality Date   BREAST SURGERY     LASIK      Social History   Socioeconomic History   Marital status: Married    Spouse name: Not on file   Number of children: Not on file   Years of education: Not on file   Highest education level: GED or equivalent  Occupational History   Not on file  Tobacco Use   Smoking status: Never   Smokeless tobacco: Never  Vaping Use   Vaping status: Never Used  Substance and Sexual Activity   Alcohol use: No   Drug use: No   Sexual activity: Not on file  Other Topics Concern   Not on file  Social History Narrative   Not on file   Social Drivers of Health   Financial Resource Strain: Low Risk  (01/18/2023)   Overall Financial Resource Strain (CARDIA)    Difficulty of Paying Living Expenses: Not hard at all  Food Insecurity: No Food Insecurity (01/18/2023)   Hunger Vital Sign    Worried About Running Out of Food in the Last Year: Never true    Ran Out of Food in the Last Year: Never true  Transportation Needs: No Transportation Needs (01/18/2023)   PRAPARE - Administrator, Civil Service (Medical):  No    Lack of Transportation (Non-Medical): No  Physical Activity: Sufficiently Active (01/18/2023)   Exercise Vital Sign    Days of Exercise per Week: 5 days    Minutes of Exercise per Session: 60 min  Stress: No Stress Concern Present (01/18/2023)   [employer] of Occupational Health - Occupational Stress Questionnaire    Feeling of Stress : Not at all  Social Connections: Unknown (01/18/2023)   Social Connection and Isolation Panel    Frequency of Communication with Friends and Family: Patient  declined    Frequency of Social Gatherings with Friends and Family: Three times a week    Attends Religious Services: Patient declined    Active Member of Clubs or Organizations: Patient declined    Attends Banker Meetings: Not on file    Marital Status: Married  Intimate Partner Violence: Unknown (11/25/2021)   Received from [facility]   HITS    Physically Hurt: Not on file    Insult or Talk Down To: Not on file    Threaten Physical Harm: Not on file    Scream or Curse: Not on file    Outpatient Encounter Medications as of 04/20/2024  Medication Sig   esomeprazole  (NEXIUM ) 40 MG capsule TAKE 1 CAPSULE (40 MG TOTAL) BY MOUTH DAILY.   fexofenadine  (ALLEGRA  ALLERGY) 180 MG tablet Take 1 tablet (180 mg total) by mouth daily.   fluconazole  (DIFLUCAN ) 150 MG tablet Take 1 tablet (150 mg total) by mouth every 3 (three) days for 14 doses.   metroNIDAZOLE  (FLAGYL ) 500 MG tablet Take 1 tablet (500 mg total) by mouth 2 (two) times daily for 7 days.   metroNIDAZOLE  (METROGEL ) 0.75 % vaginal gel Place 1 Applicatorful vaginally daily as needed (intercourse).   NIFEdipine  (PROCARDIA ) 10 MG capsule Take 1 capsule (10 mg total) by mouth in the morning and at bedtime.   Phentermine -Topiramate  (QSYMIA ) 11.25-69 MG CP24 Take 1 capsule by mouth in the morning. Month #3   Phentermine -Topiramate  (QSYMIA ) 3.75-23 MG CP24 Take 1 capsule by mouth in the morning. Month #1   Phentermine -Topiramate  (QSYMIA ) 7.5-46 MG CP24 Take 1 capsule by mouth in the morning. Month #2   triamcinolone  (NASACORT ) 55 MCG/ACT AERO nasal inhaler Place 2 sprays into the nose daily.   [DISCONTINUED] fluconazole  (DIFLUCAN ) 150 MG tablet Take 1 tablet (150 mg total) by mouth every three (3) days as needed.   No facility-administered encounter medications on file as of 04/20/2024.    No Known Allergies  Pertinent ROS per HPI, otherwise unremarkable      Objective:  BP 106/69   Pulse 65   Temp 98.9 F (37.2 C)    Ht 5' 5 (1.651 m)   Wt 197 lb (89.4 kg)   SpO2 98%   BMI 32.78 kg/m    Wt Readings from Last 3 Encounters:  04/20/24 197 lb (89.4 kg)  02/03/24 196 lb (88.9 kg)  01/06/24 194 lb (88 kg)    Physical Exam Vitals and nursing note reviewed.  Constitutional:      General: She is not in acute distress.    Appearance: Normal appearance. She is not ill-appearing, toxic-appearing or diaphoretic.  HENT:     Head: Normocephalic and atraumatic.     Mouth/Throat:     Mouth: Mucous membranes are moist.  Eyes:     Pupils: Pupils are equal, round, and reactive to light.  Cardiovascular:     Rate and Rhythm: Normal rate and regular rhythm.  Heart sounds: Normal heart sounds.  Pulmonary:     Effort: Pulmonary effort is normal.     Breath sounds: Normal breath sounds.  Genitourinary:    Comments: Did self wet prep Musculoskeletal:     Right lower leg: No edema.     Left lower leg: No edema.  Skin:    General: Skin is warm and dry.     Capillary Refill: Capillary refill takes less than 2 seconds.  Neurological:     General: No focal deficit present.     Mental Status: She is alert and oriented to person, place, and time.  Psychiatric:        Mood and Affect: Mood normal.        Behavior: Behavior normal.        Thought Content: Thought content normal.        Judgment: Judgment normal.      Results for orders placed or performed in visit on 04/20/24  WET PREP FOR TRICH, YEAST, CLUE   Collection Time: 04/20/24  1:59 PM   Specimen: Vaginal Fluid   Vaginal Flui  Result Value Ref Range   Trichomonas Exam Negative Negative   Yeast Exam Negative Negative   Clue Cell Exam Positive (A) Negative  Microscopic Examination   Collection Time: 04/20/24  1:59 PM   Urine  Result Value Ref Range   WBC, UA 0-5 0 - 5 /hpf   RBC, Urine None seen 0 - 2 /hpf   Epithelial Cells (non renal) 0-10 0 - 10 /hpf   Renal Epithel, UA None seen None seen /hpf   Bacteria, UA Few (A) None seen/Few    Yeast, UA None seen None seen  Urinalysis, Routine w reflex microscopic   Collection Time: 04/20/24  1:59 PM  Result Value Ref Range   Specific Gravity, UA 1.025 1.005 - 1.030   pH, UA 5.5 5.0 - 7.5   Color, UA Yellow Yellow   Appearance Ur Clear Clear   Leukocytes,UA Negative Negative   Protein,UA Negative Negative/Trace   Glucose, UA Negative Negative   Ketones, UA Negative Negative   RBC, UA 3+ (A) Negative   Bilirubin, UA Negative Negative   Urobilinogen, Ur 1.0 0.2 - 1.0 mg/dL   Nitrite, UA Negative Negative   Microscopic Examination See below:        Pertinent labs & imaging results that were available during my care of the patient were reviewed by me and considered in my medical decision making.  Assessment & Plan:  Sharon Foster was seen today for vaginitis.  Diagnoses and all orders for this visit:  Bacterial vaginosis -     metroNIDAZOLE  (FLAGYL ) 500 MG tablet; Take 1 tablet (500 mg total) by mouth 2 (two) times daily for 7 days.  Acute vaginitis -     WET PREP FOR TRICH, YEAST, CLUE -     Urinalysis, Routine w reflex microscopic -     Urine Culture -     fluconazole  (DIFLUCAN ) 150 MG tablet; Take 1 tablet (150 mg total) by mouth every 3 (three) days for 14 doses. -     Microscopic Examination     Recurrent bacterial vaginosis Recurrent bacterial vaginosis with presence of clue cells, symptoms include spotting, bright red bleeding, and slight odor during intercourse. Previous treatment with metronidazole  cream and boric acid suppositories. Concerns about recurrence and potential transmission from husband, although men typically do not carry BV. - Prescribe Flagyl  (metronidazole ) with instructions to avoid alcohol due to potential for  severe nausea. - Provide educational material on prevention and management of BV, including hygiene practices before and after intercourse. - Advise to increase water intake to address urinary frequency, though not directly related to  BV.  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 Previous treatments include boric acid suppositories and Diflucan  (fluconazole ). Concerns about long-term use of Diflucan  due to potential liver damage. Current regimen of oral probiotics and estrogen insert pills. - Prescribe Diflucan  (fluconazole ) every 72 hours for 14 days during and after antibiotic therapy. - Advise to use boric acid suppositories (600 mg) vaginally every night for a month after completing current treatments. - Discuss potential liver damage with long-term use of Diflucan  and emphasize monitoring if extended use is considered.  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 Previous ablation procedure intended to prevent bleeding, but some bleeding can still occur post-procedure.  Increased urinary frequency Increased urinary frequency possibly related to inadequate water intake. No evidence of urinary tract infection from current evaluation. - Advise to increase water intake to address urinary frequency. - Perform urine culture to rule out infection; follow up with treatment if culture is positive.          Continue all other maintenance medications.  Follow up plan: Return if symptoms worsen or fail to improve.   Continue healthy lifestyle choices, including diet (rich in fruits, vegetables, and lean proteins, and low in salt and simple carbohydrates) and exercise (at least 30 minutes of moderate physical activity daily).  Educational handout given for BV, vaginal hygiene  The above assessment and management plan was discussed with the patient. The patient verbalized understanding of and has agreed to the management plan. Patient is aware to call the clinic if they develop any new symptoms or if symptoms persist or worsen. Patient is aware when to return to the clinic for a  follow-up visit. Patient educated on when it is appropriate to go to the emergency department.   Sharon Bruns, FNP-C [facility] [phone]

## 2024-04-20 NOTE — Patient Instructions (Signed)
Healthy vaginal hygiene practices   -  Avoid sleeper pajamas. Nightgowns allow air to circulate.  Sleep without underpants whenever possible.  -  Wear cotton underpants during the day. Double-rinse underwear after washing to avoid residual irritants. Do not use fabric softeners for underwear and swimsuits.  - Avoid tights, leotards, leggings, "skinny" jeans, and other tight-fitting clothing. Skirts and loose-fitting pants allow air to circulate.  - Avoid pantyliners.  Instead use tampons or cotton pads.  - Daily warm bathing is helpful:     - Soak in clean water (no soap) for 10 to 15 minutes.     - Use soap to wash regions other than the genital area just before getting out of the tub or drain water and take a shower to wash your body. Limit use of any soap on genital areas. Use   fragance-free soaps.     - Rinse the genital area well and gently pat dry.  Don't rub.  Hair dryer to assist with drying can be used only if on cool setting.     - Do not use bubble baths or perfumed soaps.  - Do not use any feminine sprays, douches or powders.  These contain chemicals that will irritate the skin.  - If the genital area is tender or swollen, cool compresses may relieve the discomfort. Unscented wet wipes can be used instead of toilet paper for wiping.   - Emollients, such as Vaseline, may help protect skin and can be applied to the irritated area.  - Always remember to wipe front-to-back after bowel movements. Pat dry after urination.  - Do not sit in wet swimsuits for long periods of time after swimming  

## 2024-04-22 LAB — URINE CULTURE

## 2024-05-07 ENCOUNTER — Ambulatory Visit: Admitting: Family Medicine

## 2024-05-07 ENCOUNTER — Encounter: Payer: Self-pay | Admitting: Family Medicine

## 2024-05-07 VITALS — BP 99/68 | HR 75 | Temp 98.1°F | Ht 65.0 in | Wt 198.8 lb

## 2024-05-07 DIAGNOSIS — Z7689 Persons encountering health services in other specified circumstances: Secondary | ICD-10-CM

## 2024-05-07 DIAGNOSIS — E66811 Obesity, class 1: Secondary | ICD-10-CM | POA: Insufficient documentation

## 2024-05-07 NOTE — Progress Notes (Signed)
 Subjective: Sharon check PCP: Jolinda Norene Foster, Sharon Foster S [place] is a 48 y.o. female presenting to clinic today for: History of Present Illness  She did not tolerate the Qsymia , particularly after she had been on it for 6 weeks.  It caused her to have too much foggy headedness.  She did really well on Wegovy  but sadly her insurance will not pay for semaglutide  anymore.  She has not reached out to them again to see if her formulary had changed yet.  She continues to stay fairly physically active and they monitor their intake for the most part but she does consume sweets, particularly when on vacation like when they recently went to [place].  She has a new grandchild on the way and this will be her first grandchild.  She is really looking forward to this.  She had considered paying out-of-pocket for the GLP but given changes in finances related to her son and new grandchild she is little reluctant to spend too much out-of-pocket   ROS: Per HPI  No Known Allergies Past Medical History:  Diagnosis Date   GERD (gastroesophageal reflux disease)     Current Outpatient Medications:    esomeprazole  (NEXIUM ) 40 MG capsule, TAKE 1 CAPSULE (40 MG TOTAL) BY MOUTH DAILY., Disp: 90 capsule, Rfl: 0   fexofenadine  (ALLEGRA  ALLERGY) 180 MG tablet, Take 1 tablet (180 mg total) by mouth daily., Disp: , Rfl:    fluconazole  (DIFLUCAN ) 150 MG tablet, Take 1 tablet (150 mg total) by mouth every 3 (three) days for 14 doses., Disp: 14 tablet, Rfl: 0   metroNIDAZOLE  (METROGEL ) 0.75 % vaginal gel, Place 1 Applicatorful vaginally daily as needed (intercourse)., Disp: 70 g, Rfl: 1   NIFEdipine  (PROCARDIA ) 10 MG capsule, Take 1 capsule (10 mg total) by mouth in the morning and at bedtime. (Patient not taking: Reported on 05/07/2024), Disp: 60 capsule, Rfl: 1   Phentermine -Topiramate  (QSYMIA ) 11.25-69 MG CP24, Take 1 capsule by mouth in the morning. Month #3 (Patient not taking: Reported on 05/07/2024),  Disp: 30 capsule, Rfl: 0   Phentermine -Topiramate  (QSYMIA ) 3.75-23 MG CP24, Take 1 capsule by mouth in the morning. Month #1 (Patient not taking: Reported on 05/07/2024), Disp: 30 capsule, Rfl: 0   Phentermine -Topiramate  (QSYMIA ) 7.5-46 MG CP24, Take 1 capsule by mouth in the morning. Month #2 (Patient not taking: Reported on 05/07/2024), Disp: 30 capsule, Rfl: 0   triamcinolone  (NASACORT ) 55 MCG/ACT AERO nasal inhaler, Place 2 sprays into the nose daily. (Patient not taking: Reported on 05/07/2024), Disp: , Rfl:  Social History   Socioeconomic History   Marital status: Married    Spouse name: Not on file   Number of children: Not on file   Years of education: Not on file   Highest education level: GED or equivalent  Occupational History   Not on file  Tobacco Use   Smoking status: Never   Smokeless tobacco: Never  Vaping Use   Vaping status: Never Used  Substance and Sexual Activity   Alcohol use: No   Drug use: No   Sexual activity: Not on file  Other Topics Concern   Not on file  Social History Narrative   Not on file   Social Drivers of Health   Financial Resource Strain: Low Risk  (01/18/2023)   Overall Financial Resource Strain (CARDIA)    Difficulty of Paying Living Expenses: Not hard at all  Food Insecurity: No Food Insecurity (01/18/2023)   Hunger Vital Sign    Worried About  Running Out of Food in the Last Year: Never true    Ran Out of Food in the Last Year: Never true  Transportation Needs: No Transportation Needs (01/18/2023)   PRAPARE - Administrator, Civil Service (Medical): No    Lack of Transportation (Non-Medical): No  Physical Activity: Sufficiently Active (01/18/2023)   Exercise Vital Sign    Days of Exercise per Week: 5 days    Minutes of Exercise per Session: 60 min  Stress: No Stress Concern Present (01/18/2023)   [employer] of Occupational Health - Occupational Stress Questionnaire    Feeling of Stress : Not at all  Social Connections:  Unknown (01/18/2023)   Social Connection and Isolation Panel    Frequency of Communication with Friends and Family: Patient declined    Frequency of Social Gatherings with Friends and Family: Three times a week    Attends Religious Services: Patient declined    Active Member of Clubs or Organizations: Patient declined    Attends Banker Meetings: Not on file    Marital Status: Married  Intimate Partner Violence: Unknown (11/25/2021)   Received from [facility]   HITS    Physically Hurt: Not on file    Insult or Talk Down To: Not on file    Threaten Physical Harm: Not on file    Scream or Curse: Not on file   Family History  Problem Relation Age of Onset   Hypertension Mother    Cancer Father        Prostate   Hypertension Father     Objective: Office vital signs reviewed. BP 99/68   Pulse 75   Temp 98.1 F (36.7 C)   Ht 5' 5 (1.651 m)   Wt 198 lb 12.8 oz (90.2 kg)   SpO2 98%   BMI 33.08 kg/m   Physical Examination:  General: Awake, alert, well nourished, obese. No acute distress HEENT: sclera white, MMM Cardio: RRR Pulm: normal WOB on room air     04/20/2024    1:55 PM 02/03/2024   10:17 AM 07/25/2023    2:36 PM  Depression screen PHQ 2/9  Decreased Interest 0 0 0  Down, Depressed, Hopeless 0 0 0  PHQ - 2 Score 0 0 0  Altered sleeping  0 0  Tired, decreased energy  0 0  Change in appetite  0 0  Feeling bad or failure about yourself   0 0  Trouble concentrating  0 0  Moving slowly or fidgety/restless  0 0  Suicidal thoughts  0 0  PHQ-9 Score  0 0  Difficult doing work/chores  Not difficult at all Not difficult at all      04/20/2024    1:55 PM 02/03/2024   10:27 AM 07/25/2023    2:36 PM 05/04/2023    9:46 AM  GAD 7 : Generalized Anxiety Score  Nervous, Anxious, on Edge 0 0 0 0  Control/stop worrying 0 0 0 0  Worry too much - different things 0 0 0 0  Trouble relaxing 0 0 0 0  Restless 0 0 0 0  Easily annoyed or irritable 0 0 0 0  Afraid -  awful might happen 0 0 0 0  Total GAD 7 Score 0 0 0 0  Anxiety Difficulty Not difficult at all Not difficult at all  Not difficult at all    Assessment/ Plan: 48 y.o. female  Assessment & Plan   Obesity (BMI 30.0-34.9)  Encounter for weight  management  She is going to look into her insurance coverage for Zepbound and/or Wegovy  and will get back to me ASAP.  I be glad to prescribe either of these as they have been very helpful in the past and she has no apparent contraindications to utilization of this.  She has failed Qsymia , phentermine  due to side effects.    Norene CHRISTELLA Fielding, Sharon [facility] [phone]

## 2024-07-10 ENCOUNTER — Encounter (INDEPENDENT_AMBULATORY_CARE_PROVIDER_SITE_OTHER): Payer: Self-pay | Admitting: Family Medicine

## 2024-07-10 DIAGNOSIS — E66811 Obesity, class 1: Secondary | ICD-10-CM | POA: Diagnosis not present

## 2024-07-10 DIAGNOSIS — Z713 Dietary counseling and surveillance: Secondary | ICD-10-CM

## 2024-07-10 DIAGNOSIS — Z7689 Persons encountering health services in other specified circumstances: Secondary | ICD-10-CM

## 2024-07-11 MED ORDER — SEMAGLUTIDE-WEIGHT MANAGEMENT 1 MG/0.5ML ~~LOC~~ SOAJ: 1.0000 mg | SUBCUTANEOUS | 0 refills | Status: AC

## 2024-07-11 MED ORDER — SEMAGLUTIDE-WEIGHT MANAGEMENT 2.4 MG/0.75ML ~~LOC~~ SOAJ: 2.4000 mg | SUBCUTANEOUS | 4 refills | Status: AC

## 2024-07-11 MED ORDER — SEMAGLUTIDE-WEIGHT MANAGEMENT 0.5 MG/0.5ML ~~LOC~~ SOAJ: 0.5000 mg | SUBCUTANEOUS | 0 refills | Status: AC

## 2024-07-11 MED ORDER — SEMAGLUTIDE-WEIGHT MANAGEMENT 1.7 MG/0.75ML ~~LOC~~ SOAJ: 1.7000 mg | SUBCUTANEOUS | 0 refills | Status: AC

## 2024-07-11 MED ORDER — SEMAGLUTIDE-WEIGHT MANAGEMENT 0.25 MG/0.5ML ~~LOC~~ SOAJ: 0.2500 mg | SUBCUTANEOUS | 0 refills | Status: AC

## 2024-07-11 NOTE — Telephone Encounter (Signed)
Please see the MyChart message reply(ies) for my assessment and plan.    This patient gave consent for this Medical Advice Message and is aware that it may result in a bill to [employer], as well as the possibility of receiving a bill for a co-payment or deductible. They are an established patient, but are not seeking medical advice exclusively about a problem treated during an in person or video visit in the last seven days. I did not recommend an in person or video visit within seven days of my reply.    I spent a total of 6 minutes cumulative time within 7 days through [employer].  Delynn Flavin, DO

## 2024-08-01 ENCOUNTER — Other Ambulatory Visit: Payer: Self-pay | Admitting: Family Medicine

## 2024-08-01 DIAGNOSIS — K219 Gastro-esophageal reflux disease without esophagitis: Secondary | ICD-10-CM

## 2024-08-03 ENCOUNTER — Encounter: Payer: Self-pay | Admitting: Family Medicine

## 2024-09-27 ENCOUNTER — Ambulatory Visit: Payer: Self-pay | Admitting: Family Medicine

## 2024-09-27 ENCOUNTER — Encounter: Payer: Self-pay | Admitting: Family Medicine

## 2024-09-27 ENCOUNTER — Ambulatory Visit: Admitting: Family Medicine

## 2024-09-27 VITALS — BP 123/78 | HR 70 | Temp 98.0°F | Ht 65.0 in | Wt 198.6 lb

## 2024-09-27 DIAGNOSIS — B9689 Other specified bacterial agents as the cause of diseases classified elsewhere: Secondary | ICD-10-CM

## 2024-09-27 DIAGNOSIS — N898 Other specified noninflammatory disorders of vagina: Secondary | ICD-10-CM

## 2024-09-27 DIAGNOSIS — B3731 Acute candidiasis of vulva and vagina: Secondary | ICD-10-CM

## 2024-09-27 LAB — WET PREP FOR TRICH, YEAST, CLUE
Clue Cell Exam: POSITIVE — AB
Trichomonas Exam: NEGATIVE
Yeast Exam: NEGATIVE

## 2024-09-27 MED ORDER — METRONIDAZOLE 500 MG PO TABS: 500.0000 mg | ORAL_TABLET | Freq: Two times a day (BID) | ORAL | 0 refills | Status: AC

## 2024-09-27 MED ORDER — FLUCONAZOLE 150 MG PO TABS: 150.0000 mg | ORAL_TABLET | Freq: Once | ORAL | 0 refills | Status: AC

## 2024-09-27 NOTE — Progress Notes (Signed)
 "    Subjective:  Patient ID: Sharon Foster, female    DOB: Dec 25, 1975, 49 y.o.   MRN: [id number]  Patient Care Team: Jolinda Norene HERO, DO as PCP - General (Family Medicine)   Chief Complaint:  vaginal odor (X 2 weeks ago but worse on Monday. Smell worse during intercourse )   HPI: Sharon Foster is a 49 y.o. female presenting on 09/27/2024 for vaginal odor (X 2 weeks ago but worse on Monday. Smell worse during intercourse )    Sharon Foster is a 49 year old female who presents with recurrent episodes of bacterial vaginosis.  She experiences recurrent episodes of bacterial vaginosis characterized by itching, burning during intercourse, and a noticeable odor. These symptoms have been present intermittently since her ablation five years ago. The current episode began with a noticeable odor last week, which intensified by Sunday or Monday of this week. No discharge is noted with the current episode.  She uses probiotics and suppositories, excluding boric acid, twice a week as part of her management strategy. She has previously tried boric acid suppositories but did not find them effective. She denies any new sexual partners. Her partner has previously required antibiotics when she experienced BV.  She experiences spotting episodes that typically precede the onset of BV symptoms. These spotting episodes usually last for one day and then resolve.  Additionally, she reports a history of easy bruising. She has seen a rheumatologist, who told her that she has thin skin. She also mentions developing skin crystals as she ages.          Relevant past medical, surgical, family, and social history reviewed and updated as indicated.  Allergies and medications reviewed and updated. Data reviewed: Chart in Epic.   Past Medical History:  Diagnosis Date   GERD (gastroesophageal reflux disease)     Past Surgical History:  Procedure Laterality Date   BREAST SURGERY     LASIK       Social History   Socioeconomic History   Marital status: Married    Spouse name: Not on file   Number of children: Not on file   Years of education: Not on file   Highest education level: GED or equivalent  Occupational History   Not on file  Tobacco Use   Smoking status: Never   Smokeless tobacco: Never  Vaping Use   Vaping status: Never Used  Substance and Sexual Activity   Alcohol use: No   Drug use: No   Sexual activity: Not on file  Other Topics Concern   Not on file  Social History Narrative   Not on file   Social Drivers of Health   Tobacco Use: Low Risk (09/27/2024)   Patient History    Smoking Tobacco Use: Never    Smokeless Tobacco Use: Never    Passive Exposure: Not on file  Financial Resource Strain: Low Risk (01/18/2023)   Overall Financial Resource Strain (CARDIA)    Difficulty of Paying Living Expenses: Not hard at all  Food Insecurity: No Food Insecurity (01/18/2023)   Hunger Vital Sign    Worried About Running Out of Food in the Last Year: Never true    Ran Out of Food in the Last Year: Never true  Transportation Needs: No Transportation Needs (01/18/2023)   PRAPARE - Administrator, Civil Service (Medical): No    Lack of Transportation (Non-Medical): No  Physical Activity: Sufficiently Active (01/18/2023)   Exercise Vital Sign  Days of Exercise per Week: 5 days    Minutes of Exercise per Session: 60 min  Stress: No Stress Concern Present (01/18/2023)   [employer] of Occupational Health - Occupational Stress Questionnaire    Feeling of Stress : Not at all  Social Connections: Unknown (01/18/2023)   Social Connection and Isolation Panel    Frequency of Communication with Friends and Family: Patient declined    Frequency of Social Gatherings with Friends and Family: Three times a week    Attends Religious Services: Patient declined    Active Member of Clubs or Organizations: Patient declined    Attends Banker  Meetings: Not on file    Marital Status: Married  Intimate Partner Violence: Unknown (11/25/2021)   Received from [facility]   HITS    Physically Hurt: Not on file    Insult or Talk Down To: Not on file    Threaten Physical Harm: Not on file    Scream or Curse: Not on file  Depression (PHQ2-9): Low Risk (09/27/2024)   Depression (PHQ2-9)    PHQ-2 Score: 0  Alcohol Screen: Low Risk (01/18/2023)   Alcohol Screen    Last Alcohol Screening Score (AUDIT): 1  Housing: Low Risk (01/18/2023)   Housing    Last Housing Risk Score: 0  Utilities: Not on file  Health Literacy: Not on file    Outpatient Encounter Medications as of 09/27/2024  Medication Sig   esomeprazole  (NEXIUM ) 40 MG capsule TAKE 1 CAPSULE (40 MG TOTAL) BY MOUTH DAILY.   fexofenadine  (ALLEGRA  ALLERGY) 180 MG tablet Take 1 tablet (180 mg total) by mouth daily.   fluconazole  (DIFLUCAN ) 150 MG tablet Take 1 tablet (150 mg total) by mouth once for 1 dose. May repeat after 3 days if needed.   metroNIDAZOLE  (FLAGYL ) 500 MG tablet Take 1 tablet (500 mg total) by mouth 2 (two) times daily for 14 days.   triamcinolone  (NASACORT ) 55 MCG/ACT AERO nasal inhaler Place 2 sprays into the nose daily.   metroNIDAZOLE  (METROGEL ) 0.75 % vaginal gel Place 1 Applicatorful vaginally daily as needed (intercourse). (Patient not taking: Reported on 09/27/2024)   NIFEdipine  (PROCARDIA ) 10 MG capsule Take 1 capsule (10 mg total) by mouth in the morning and at bedtime. (Patient not taking: Reported on 09/27/2024)   Phentermine -Topiramate  (QSYMIA ) 11.25-69 MG CP24 Take 1 capsule by mouth in the morning. Month #3 (Patient not taking: Reported on 09/27/2024)   Phentermine -Topiramate  (QSYMIA ) 3.75-23 MG CP24 Take 1 capsule by mouth in the morning. Month #1 (Patient not taking: Reported on 09/27/2024)   Phentermine -Topiramate  (QSYMIA ) 7.5-46 MG CP24 Take 1 capsule by mouth in the morning. Month #2 (Patient not taking: Reported on 09/27/2024)   semaglutide -weight management  (WEGOVY ) 1 MG/0.5ML SOAJ SQ injection Inject 1 mg into the skin once a week for 28 days. (Patient not taking: Reported on 09/27/2024)   [START ON 10/06/2024] semaglutide -weight management (WEGOVY ) 1.7 MG/0.75ML SOAJ SQ injection Inject 1.7 mg into the skin once a week for 28 days. (Patient not taking: Reported on 09/27/2024)   [START ON 11/04/2024] semaglutide -weight management (WEGOVY ) 2.4 MG/0.75ML SOAJ SQ injection Inject 2.4 mg into the skin once a week. (Patient not taking: Reported on 09/27/2024)   No facility-administered encounter medications on file as of 09/27/2024.    Allergies[1]  Pertinent ROS per HPI, otherwise unremarkable      Objective:  BP 123/78   Pulse 70   Temp 98 F (36.7 C)   Ht 5' 5 (1.651 m)  Wt 198 lb 9.6 oz (90.1 kg)   SpO2 100%   BMI 33.05 kg/m    Wt Readings from Last 3 Encounters:  09/27/24 198 lb 9.6 oz (90.1 kg)  05/07/24 198 lb 12.8 oz (90.2 kg)  04/20/24 197 lb (89.4 kg)    Physical Exam Vitals and nursing note reviewed.  Constitutional:      General: She is not in acute distress.    Appearance: Normal appearance. She is obese. She is not ill-appearing, toxic-appearing or diaphoretic.  HENT:     Head: Normocephalic and atraumatic.     Nose: Nose normal.     Mouth/Throat:     Mouth: Mucous membranes are moist.  Eyes:     Pupils: Pupils are equal, round, and reactive to light.  Cardiovascular:     Rate and Rhythm: Normal rate.  Pulmonary:     Effort: Pulmonary effort is normal.  Skin:    General: Skin is warm and dry.     Capillary Refill: Capillary refill takes less than 2 seconds.  Neurological:     General: No focal deficit present.     Mental Status: She is alert and oriented to person, place, and time.  Psychiatric:        Mood and Affect: Mood normal.        Behavior: Behavior normal.        Thought Content: Thought content normal.        Judgment: Judgment normal.      Results for orders placed or performed in visit on  09/27/24  WET PREP FOR TRICH, YEAST, CLUE   Collection Time: 09/27/24  3:08 PM   Specimen: Vaginal Swab   Vaginal Swab  Result Value Ref Range   Trichomonas Exam Negative Negative   Yeast Exam Negative Negative   Clue Cell Exam Positive (A) Negative       Pertinent labs & imaging results that were available during my care of the patient were reviewed by me and considered in my medical decision making.  Assessment & Plan:  Sharon Foster was seen today for vaginal odor.  Diagnoses and all orders for this visit:  Vaginal odor -     WET PREP FOR TRICH, YEAST, CLUE  Bacterial vaginosis -     metroNIDAZOLE  (FLAGYL ) 500 MG tablet; Take 1 tablet (500 mg total) by mouth 2 (two) times daily for 14 days.  Vagina, candidiasis -     fluconazole  (DIFLUCAN ) 150 MG tablet; Take 1 tablet (150 mg total) by mouth once for 1 dose. May repeat after 3 days if needed.        Bacterial vaginosis Recurrent bacterial vaginosis with symptoms of itching, burning during intercourse, and malodor. Symptoms have worsened since last week, with significant increase in odor starting Sunday or Monday. No new sexual partners. Previous episodes have been associated with spotting. Current treatment includes biotics and suppositories, but symptoms persist. - Prescribed Flagyl  for a 14-day course, with a 7-day course for her and a 7-day course for her partner. - Advised against alcohol consumption while on Flagyl  due to risk of vomiting. - Prescribed Diflucan  as needed. - Sent prescription to pharmacy.          Continue all other maintenance medications.  Follow up plan: Return if symptoms worsen or fail to improve.   Continue healthy lifestyle choices, including diet (rich in fruits, vegetables, and lean proteins, and low in salt and simple carbohydrates) and exercise (at least 30 minutes of moderate physical activity daily).  The above assessment and management plan was discussed with the patient. The patient  verbalized understanding of and has agreed to the management plan. Patient is aware to call the clinic if they develop any new symptoms or if symptoms persist or worsen. Patient is aware when to return to the clinic for a follow-up visit. Patient educated on when it is appropriate to go to the emergency department.   Rosaline Bruns, FNP-C [facility] [phone]     [1] No Known Allergies  "

## 2025-02-15 ENCOUNTER — Encounter: Payer: Self-pay | Admitting: Family Medicine
